# Patient Record
Sex: Male | Born: 1955 | Race: White | Hispanic: No | Marital: Single
Health system: Midwestern US, Academic
[De-identification: ages and names within clinical notes are randomized; demographics above are authoritative.]

---

## 2016-09-18 ENCOUNTER — Encounter: Admit: 2016-09-18 | Discharge: 2016-09-18 | Payer: MEDICARE

## 2016-09-24 ENCOUNTER — Encounter: Admit: 2016-09-24 | Discharge: 2016-09-24 | Payer: MEDICARE

## 2016-10-07 ENCOUNTER — Encounter: Admit: 2016-10-07 | Discharge: 2016-10-07 | Payer: MEDICARE

## 2016-10-07 DIAGNOSIS — F1190 Opioid use, unspecified, uncomplicated: ICD-10-CM

## 2016-10-07 DIAGNOSIS — H3530 Unspecified macular degeneration: ICD-10-CM

## 2016-10-07 DIAGNOSIS — E785 Hyperlipidemia, unspecified: ICD-10-CM

## 2016-10-07 DIAGNOSIS — R270 Ataxia, unspecified: ICD-10-CM

## 2016-10-07 DIAGNOSIS — M79606 Pain in leg, unspecified: ICD-10-CM

## 2016-10-07 DIAGNOSIS — I739 Peripheral vascular disease, unspecified: Principal | ICD-10-CM

## 2016-10-07 DIAGNOSIS — M549 Dorsalgia, unspecified: ICD-10-CM

## 2016-10-07 DIAGNOSIS — Z720 Tobacco use: ICD-10-CM

## 2016-10-07 DIAGNOSIS — E119 Type 2 diabetes mellitus without complications: ICD-10-CM

## 2016-10-07 DIAGNOSIS — I10 Essential (primary) hypertension: ICD-10-CM

## 2016-10-08 ENCOUNTER — Ambulatory Visit: Admit: 2016-10-08 | Discharge: 2016-10-08 | Payer: MEDICARE

## 2016-10-08 ENCOUNTER — Encounter: Admit: 2016-10-08 | Discharge: 2016-10-08 | Payer: MEDICARE

## 2016-10-08 DIAGNOSIS — M549 Dorsalgia, unspecified: ICD-10-CM

## 2016-10-08 DIAGNOSIS — E7800 Pure hypercholesterolemia, unspecified: ICD-10-CM

## 2016-10-08 DIAGNOSIS — E1151 Type 2 diabetes mellitus with diabetic peripheral angiopathy without gangrene: ICD-10-CM

## 2016-10-08 DIAGNOSIS — M79606 Pain in leg, unspecified: ICD-10-CM

## 2016-10-08 DIAGNOSIS — I739 Peripheral vascular disease, unspecified: Principal | ICD-10-CM

## 2016-10-08 DIAGNOSIS — Z720 Tobacco use: ICD-10-CM

## 2016-10-08 DIAGNOSIS — I10 Essential (primary) hypertension: ICD-10-CM

## 2016-10-08 DIAGNOSIS — H3530 Unspecified macular degeneration: ICD-10-CM

## 2016-10-08 DIAGNOSIS — F1190 Opioid use, unspecified, uncomplicated: ICD-10-CM

## 2016-10-08 DIAGNOSIS — R270 Ataxia, unspecified: ICD-10-CM

## 2016-10-08 DIAGNOSIS — E119 Type 2 diabetes mellitus without complications: ICD-10-CM

## 2016-10-08 DIAGNOSIS — E785 Hyperlipidemia, unspecified: ICD-10-CM

## 2016-10-09 NOTE — Progress Notes
Date of Service: 10/08/2016              Chief Complaint   Patient presents with   ??? Claudication   ??? Peripheral Artery Disease       History of Present Illness    He is a 61 year old male who presents with bilateral calf pain.  He reports walking approximately 1/8 of a mile prior to onset of the pain.  He denies any pain at rest in his feet or toes.  He denies any open ulcers.  He is walking significantly limits his activities of daily living.  He had previous noninvasive testing including an ankle-brachial index in 2016 that was 0.73 on the right and 0.68 on the left.  An MR angiogram at that time showed left common iliac artery narrowing as well as  some tibial artery disease.  He reports that his symptoms have worsened some over the last 2 years.    He denies any chest pain or shortness of breath.           Past Medical History:   Diagnosis Date   ??? Ataxia    ??? Chronic back pain    ??? Chronic, continuous use of opioids    ??? Diabetes mellitus (HCC)    ??? Hyperlipidemia    ??? Hypertension    ??? Leg pain    ??? Macular degeneration    ??? Peripheral arterial disease (HCC)    ??? Tobacco abuse        Past Surgical History:   Procedure Laterality Date   ??? HERNIA REPAIR Right 2015   ??? APPENDECTOMY     ??? CHOLECYSTECTOMY     ??? TENDON REPAIR      Forearm        Allergies:  Allergies   Allergen Reactions   ??? Ambien [Zolpidem] SEE COMMENTS     Confusion and disorientation   ??? Duragesic [Fentanyl] HIVES and RASH       Medication List:  ??? aspirin 81 mg chewable tablet Chew 81 mg by mouth daily. Take with food.   ??? atorvastatin (LIPITOR) 20 mg tablet Take 20 mg by mouth at bedtime daily.   ??? diazePAM (VALIUM) 10 mg tablet TAKE 1 TABLET BY MOUTH EVERY SIX HOURS AS NEEDED FOR ANXIETY   ??? hydroCHLOROthiazide (HYDRODIURIL) 25 mg tablet Take 25 mg by mouth every morning.   ??? LEVEMIR FLEXTOUCH U-100 INSULN 100 unit/mL (3 mL) injection pen INJECT 50 UNITS SUB-Q TWO TIMES A DAY ??? methadone (DOLOPHINE; METHADOSE) 10 mg tablet TAKE 4-6 TABLETS BY MOUTH EVERY FOUR HOURS AS NEEDED (MAX 16 TABS/24 HOURS)   ??? NOVOLOG FLEXPEN U-100 INSULIN 100 unit/mL injection PEN INJECT 15 UNITS SUB-Q THREE TIMES DAILY WITH MEALS   ??? oxyCODONE (ROXICODONE, OXY-IR) 5 mg tablet Take 5 mg by mouth   ??? ramipril (ALTACE) 10 mg capsule Take 10 mg by mouth daily.       Social History:   reports that he quit smoking about 4 months ago. His smoking use included Cigarettes. He smoked 1.00 pack per day. He has never used smokeless tobacco. He reports that he drinks alcohol. He reports that he does not use drugs.    Family History   Problem Relation Age of Onset   ??? Circulatory problem Father    ??? Hypertension Father    ??? Circulatory problem Brother    ??? Hypertension Sister    ??? Hypertension Paternal Grandfather        Review  of Systems   HENT: Positive for hearing loss and tinnitus.    Eyes: Positive for visual disturbance.   Respiratory: Positive for apnea.    Musculoskeletal: Positive for arthralgias, back pain and myalgias.   Neurological: Positive for dizziness, light-headedness and numbness.   Psychiatric/Behavioral: The patient is nervous/anxious.    All other systems reviewed and are negative.              Vitals:    10/08/16 1324   BP: 150/90   Pulse: 72   Resp: 16   Weight: 88.5 kg (195 lb)   Height: 175.3 cm (69)     Body mass index is 28.8 kg/m???.     Physical Exam   Constitutional: He is oriented to person, place, and time. He appears well-developed and well-nourished. No distress.   Neck: Neck supple.   No carotid bruits   Cardiovascular: Normal rate and regular rhythm.    No murmur heard.  Pulmonary/Chest: Effort normal and breath sounds normal.   Abdominal: Soft. He exhibits no distension. There is no tenderness.   Musculoskeletal:   Palpable radial and femoral pulses (right femoral stronger than left)  Monophasic Doppler signals in pedal pulses bilaterally  No significant peripheral edema No areas of skin breakdown   Neurological: He is alert and oriented to person, place, and time.   No focal neurologic deficits   Psychiatric: He has a normal mood and affect. His behavior is normal.   Nursing note and vitals reviewed.          Assessment and Plan:    1. Peripheral artery disease (HCC)     2. Type II diabetes mellitus with peripheral circulatory disorder (HCC)     3.  Hypercholesterolemia    I outlined the potential causes of leg pain.  It is possible that his leg pain is due to arterial insufficiency.  He may also have a component of nerve compression from his back that is also contributing to his pain. I discussed options to decrease further atherosclerotic progression including good blood pressure and cholesterol control as well as good blood sugar control.  I also discussed an exercise walking program.  He is currently doing a significant exercise program, which has not improved his walking distance.  I discussed potential further evaluation with angiogram and possible percutaneous intervention.  The risks and potential benefits of angiogram as well as intervention were discussed in detail.  These risks include but are not limited to bleeding, infection, contrast reaction (nephropathy or allergy), cardiac/pulmonary complications, and death.  He acknowledged and requested to proceed.  He feels that his pain is severe enough to warrant intervention at this time understanding that this may not completely alleviate his symptoms.  We will schedule angiogram with possible percutaneous intervention in the near future.    Gladstone Lighter, MD

## 2016-10-12 ENCOUNTER — Encounter: Admit: 2016-10-12 | Discharge: 2016-10-12 | Payer: MEDICARE

## 2016-10-12 DIAGNOSIS — H3530 Unspecified macular degeneration: ICD-10-CM

## 2016-10-12 DIAGNOSIS — E119 Type 2 diabetes mellitus without complications: ICD-10-CM

## 2016-10-12 DIAGNOSIS — Z720 Tobacco use: ICD-10-CM

## 2016-10-12 DIAGNOSIS — M549 Dorsalgia, unspecified: ICD-10-CM

## 2016-10-12 DIAGNOSIS — F1190 Opioid use, unspecified, uncomplicated: ICD-10-CM

## 2016-10-12 DIAGNOSIS — R270 Ataxia, unspecified: ICD-10-CM

## 2016-10-12 DIAGNOSIS — M79606 Pain in leg, unspecified: ICD-10-CM

## 2016-10-12 DIAGNOSIS — I10 Essential (primary) hypertension: ICD-10-CM

## 2016-10-12 DIAGNOSIS — E785 Hyperlipidemia, unspecified: ICD-10-CM

## 2016-10-12 DIAGNOSIS — I739 Peripheral vascular disease, unspecified: Principal | ICD-10-CM

## 2016-10-13 ENCOUNTER — Encounter: Admit: 2016-10-13 | Discharge: 2016-10-13 | Payer: MEDICARE

## 2016-10-13 DIAGNOSIS — I739 Peripheral vascular disease, unspecified: Principal | ICD-10-CM

## 2016-10-13 NOTE — Telephone Encounter
-----   Message from Gladstone Lighter, MD sent at 10/12/2016  2:44 PM CDT -----  Please schedule for AARO, possible intervention at [facility]  KH

## 2016-10-13 NOTE — Telephone Encounter
Contacted the patient to coordinate the [facility], he was provided the information listed below and encouraged to call with any questions or concerns.    Your cath lab procedure is scheduled for Friday November 14, 2016     The cath lab team will call you the day before the scheduled procedure between 0800 AM and 1030 AM to tell you what time to check in. The cath lab check in is in the heart hospital. The heart hospital is through the main entrance of [facility]  and take a hard right. Go about 40 steps and you will see a glass atrium with glass sculptures hanging from the ceiling. There is a desk to the left and that is the check in. Do NOT go into the [place] cardiology offices.     You will be nothing by mouth (NPO) after midnight, they day before your surgery. Hold all regular insulins that morning and if you take lantus at bedtime normally, take 1/3rd of the dose instead of the full dose night before surgery. Take your morning blood pressure and heart pills as prescribed with a sip of water. Hold oral blood sugar lowering medications as well in the morning.       You will not spend the night after your procedure unless otherwise instructed  or unless you have a complication. Please have a ride arranged as you will most likely receive sedation and will not be ok to drive.     For other questions and concerns , call Joni Reining, RN at  [phone].

## 2016-11-12 ENCOUNTER — Ambulatory Visit: Admit: 2016-11-12 | Discharge: 2016-11-12 | Payer: MEDICARE

## 2016-11-14 ENCOUNTER — Encounter: Admit: 2016-11-14 | Discharge: 2016-11-14 | Payer: MEDICARE

## 2016-11-14 ENCOUNTER — Ambulatory Visit: Admit: 2016-11-14 | Discharge: 2016-11-14 | Payer: MEDICARE

## 2016-11-14 DIAGNOSIS — Z7982 Long term (current) use of aspirin: ICD-10-CM

## 2016-11-14 DIAGNOSIS — I739 Peripheral vascular disease, unspecified: Principal | ICD-10-CM

## 2016-11-14 DIAGNOSIS — H3530 Unspecified macular degeneration: ICD-10-CM

## 2016-11-14 DIAGNOSIS — Z87891 Personal history of nicotine dependence: ICD-10-CM

## 2016-11-14 DIAGNOSIS — Z720 Tobacco use: ICD-10-CM

## 2016-11-14 DIAGNOSIS — E785 Hyperlipidemia, unspecified: ICD-10-CM

## 2016-11-14 DIAGNOSIS — E119 Type 2 diabetes mellitus without complications: ICD-10-CM

## 2016-11-14 DIAGNOSIS — E1151 Type 2 diabetes mellitus with diabetic peripheral angiopathy without gangrene: ICD-10-CM

## 2016-11-14 DIAGNOSIS — M549 Dorsalgia, unspecified: ICD-10-CM

## 2016-11-14 DIAGNOSIS — I10 Essential (primary) hypertension: ICD-10-CM

## 2016-11-14 DIAGNOSIS — G8929 Other chronic pain: ICD-10-CM

## 2016-11-14 DIAGNOSIS — F1190 Opioid use, unspecified, uncomplicated: ICD-10-CM

## 2016-11-14 DIAGNOSIS — M79606 Pain in leg, unspecified: ICD-10-CM

## 2016-11-14 DIAGNOSIS — R270 Ataxia, unspecified: ICD-10-CM

## 2016-11-14 LAB — CBC AND DIFF
Lab: 0.2 10*3/uL (ref 0–0.20)
Lab: 0.3 10*3/uL (ref 0–0.45)
Lab: 0.6 10*3/uL (ref 0–0.80)
Lab: 10 10*3/uL (ref 4.5–11.0)
Lab: 2 % (ref 0–2)
Lab: 2.5 10*3/uL (ref 1.0–4.8)
Lab: 3 % (ref 0–5)
Lab: 6 % (ref 4–12)
Lab: 6.8 10*3/uL (ref 1.8–7.0)

## 2016-11-14 LAB — BASIC METABOLIC PANEL
Lab: 0.7 mg/dL (ref 0.4–1.24)
Lab: 135 MMOL/L — ABNORMAL LOW (ref 137–147)
Lab: 16 mg/dL (ref 7–25)
Lab: 283 mg/dL — ABNORMAL HIGH (ref 70–100)
Lab: 29 MMOL/L (ref 21–30)
Lab: 3.7 MMOL/L (ref 3.5–5.1)
Lab: 7 pg (ref 3–12)
Lab: 9.1 mg/dL (ref 8.5–10.6)
Lab: 99 MMOL/L (ref 98–110)
Lab: >60 mL/min (ref >60)
Lab: >60 mL/min (ref >60)

## 2016-11-14 LAB — POC GLUCOSE: Lab: 254 mg/dL — ABNORMAL HIGH (ref 70–100)

## 2016-11-14 MED ORDER — NITROGLYCERIN 0.4 MG SL SUBL
.4 mg | SUBLINGUAL | 0 refills | Status: DC | PRN
Start: 2016-11-14 — End: 2016-11-14

## 2016-11-14 MED ORDER — DIPHENHYDRAMINE HCL 25 MG PO CAP
25 mg | ORAL | 0 refills | Status: DC | PRN
Start: 2016-11-14 — End: 2016-11-14

## 2016-11-14 MED ORDER — SODIUM CHLORIDE 0.9 % IV SOLP
250 mL | INTRAVENOUS | 0 refills | Status: DC | PRN
Start: 2016-11-14 — End: 2016-11-14

## 2016-11-14 MED ORDER — ACETAMINOPHEN 325 MG PO TAB
650 mg | ORAL | 0 refills | Status: AC | PRN
Start: 2016-11-14 — End: ?

## 2016-11-14 MED ORDER — ALUMINUM-MAGNESIUM HYDROXIDE 200-200 MG/5 ML PO SUSP
30 mL | ORAL | 0 refills | Status: DC | PRN
Start: 2016-11-14 — End: 2016-11-14

## 2016-11-14 MED ORDER — DIPHENHYDRAMINE HCL 50 MG/ML IJ SOLN
25 mg | INTRAVENOUS | 0 refills | Status: DC | PRN
Start: 2016-11-14 — End: 2016-11-14

## 2016-11-14 MED ORDER — SODIUM CHLORIDE 0.9 % IV SOLP
INTRAVENOUS | 0 refills | Status: DC
Start: 2016-11-14 — End: 2016-11-14

## 2016-11-14 MED ORDER — ACETAMINOPHEN 325 MG PO TAB
650 mg | ORAL | 0 refills | Status: DC | PRN
Start: 2016-11-14 — End: 2016-11-14

## 2016-11-14 MED ORDER — SODIUM CHLORIDE 0.9 % IV SOLP
INTRAVENOUS | 0 refills | Status: DC
Start: 2016-11-14 — End: 2016-11-14
  Administered 2016-11-14: 12:00:00 1000.000 mL via INTRAVENOUS

## 2016-11-14 MED ORDER — TEMAZEPAM 15 MG PO CAP
15 mg | Freq: Every evening | ORAL | 0 refills | Status: DC | PRN
Start: 2016-11-14 — End: 2016-11-14

## 2016-11-14 MED ORDER — DOCUSATE SODIUM 100 MG PO CAP
100 mg | Freq: Every day | ORAL | 0 refills | Status: DC | PRN
Start: 2016-11-14 — End: 2016-11-14

## 2016-11-14 MED ORDER — HYDROCODONE-ACETAMINOPHEN 5-325 MG PO TAB
1 | ORAL | 0 refills | Status: DC | PRN
Start: 2016-11-14 — End: 2016-11-14

## 2016-11-14 MED ORDER — LIDOCAINE (PF) 10 MG/ML (1 %) IJ SOLN
.1-2 mL | INTRAMUSCULAR | 0 refills | Status: DC | PRN
Start: 2016-11-14 — End: 2016-11-14

## 2016-11-14 MED ORDER — MORPHINE 2 MG/ML IV CRTG
2-4 mg | INTRAVENOUS | 0 refills | Status: DC | PRN
Start: 2016-11-14 — End: 2016-11-14

## 2016-11-14 MED ORDER — ASPIRIN 325 MG PO TAB
325 mg | Freq: Once | ORAL | 0 refills | Status: CP
Start: 2016-11-14 — End: ?
  Administered 2016-11-14: 13:00:00 325 mg via ORAL

## 2016-11-14 MED ORDER — ONDANSETRON HCL (PF) 4 MG/2 ML IJ SOLN
4 mg | INTRAVENOUS | 0 refills | Status: DC | PRN
Start: 2016-11-14 — End: 2016-11-14

## 2016-11-14 NOTE — Progress Notes
Patient arrived on unit via ambulation accompanied by family. Patient transferred to the bed without assistance. Frailty score equals 2  Assessment completed, refer to flowsheet for details. Orders released, reviewed, and implemented as appropriate. Oriented to surroundings, call light within reach. Plan of care reviewed.  Will continue to monitor and assess.

## 2016-11-14 NOTE — H&P (View-Only)
Admission History and Physical Examination      Name:  Angel Morgan                                             MRN:  [id number]   Admission Date:  11/14/2016                     Assessment/Plan:    Claudication   PAD  __________________________________________________________________________________  Primary Care Physician: Keturah Barre  Verified    Chief Complaint:  Bilateral calf pain   History of Present Illness: He is a 61 year old male who presents with bilateral calf pain.  He reports walking approximately 1/8 of a mile prior to onset of the pain.  He denies any pain at rest in his feet or toes.  He denies any open ulcers.  He had previous noninvasive testing including an ankle-brachial index in 2016 that was 0.73 on the right and 0.68 on the left.  An MR angiogram at that time showed left common iliac artery narrowing as well as  some tibial artery disease.  He reports that his symptoms have worsened some over the last 2 years. He would like to proceed with an aaortogram with bilateral runoff an possible intervention to assess his leg arteries    Past Medical History:   Diagnosis Date   ??? Ataxia    ??? Chronic back pain    ??? Chronic, continuous use of opioids    ??? Diabetes mellitus (HCC)    ??? Hyperlipidemia    ??? Hypertension    ??? Leg pain    ??? Macular degeneration    ??? Peripheral arterial disease (HCC)    ??? Tobacco abuse      Past Surgical History:   Procedure Laterality Date   ??? HERNIA REPAIR Right 2015   ??? APPENDECTOMY     ??? CHOLECYSTECTOMY     ??? TENDON REPAIR      Forearm      Family history reviewed; non-contributory  Social History     Social History   ??? Marital status: Single     Spouse name: N/A   ??? Number of children: N/A   ??? Years of education: N/A     Social History Main Topics   ??? Smoking status: Former Smoker     Packs/day: 1.00     Types: Cigarettes     Quit date: 06/07/2016   ??? Smokeless tobacco: Never Used   ??? Alcohol use Yes      Comment: Rare ??? Drug use: No   ??? Sexual activity: Not on file     Other Topics Concern   ??? Not on file     Social History Narrative   ??? No narrative on file      Immunizations (includes history and patient reported):   There is no immunization history on file for this patient.        Allergies:  Ambien [zolpidem] and Duragesic [fentanyl]    Medications:  Prescriptions Prior to Admission   Medication Sig   ??? aspirin 81 mg chewable tablet Chew 81 mg by mouth daily. Take with food.   ??? atorvastatin (LIPITOR) 20 mg tablet Take 20 mg by mouth at bedtime daily.   ??? diazePAM (VALIUM) 10 mg tablet TAKE 1 TABLET BY MOUTH EVERY SIX HOURS AS NEEDED FOR ANXIETY   ???  hydroCHLOROthiazide (HYDRODIURIL) 25 mg tablet Take 25 mg by mouth every morning.   ??? LEVEMIR FLEXTOUCH U-100 INSULN 100 unit/mL (3 mL) injection pen INJECT 50 UNITS SUB-Q TWO TIMES A DAY   ??? methadone (DOLOPHINE; METHADOSE) 10 mg tablet TAKE 4-6 TABLETS BY MOUTH EVERY FOUR HOURS AS NEEDED (MAX 16 TABS/24 HOURS)   ??? NOVOLOG FLEXPEN U-100 INSULIN 100 unit/mL injection PEN INJECT 15 UNITS SUB-Q THREE TIMES DAILY WITH MEALS   ??? oxyCODONE (ROXICODONE, OXY-IR) 5 mg tablet Take 5 mg by mouth   ??? ramipril (ALTACE) 10 mg capsule Take 10 mg by mouth daily.     Review of Systems:  A 14 point review of systems was negative except for: leg pain, back pain    Physical Exam:  Vital Signs: Last Filed In 24 Hours Vital Signs: 24 Hour Range                Physical Exam:   Constitutional: He is oriented to person, place, and time. He appears well-developed and well-nourished. No distress.   Neck: Neck supple.   No carotid bruits   Cardiovascular: RRR  No murmur heard.  Pulmonary/Chest: Effort normal and breath sounds normal.   Abdominal: Soft. He exhibits no distension. There is no tenderness.   Musculoskeletal:   Palpable radial and femoral pulses  Monophasic Doppler signals in pedal pulses bilaterally  No significant peripheral edema  No areas of skin breakdown Neurological: He is alert and oriented to person, place, and time.   No focal neurologic deficits   Psychiatric: He has a normal mood and affect. His behavior is normal.       Lab/Radiology/Other Diagnostic Tests:  24-hour labs:    Results for orders placed or performed during the hospital encounter of 11/14/16 (from the past 24 hour(s))   POC GLUCOSE    Collection Time: 11/14/16  6:54 AM   Result Value Ref Range    Glucose, POC 254 (H) 70 - 100 MG/DL        Pertinent radiology reviewed.    Jonette Eva, MD  Pager [phone]

## 2016-11-14 NOTE — Other
Brief Operative Note    Name: Angel Morgan. Brannigan is a 61 y.o. male     DOB: [birth year]             MRN#: [id number]  DATE OF OPERATION: 11/14/2016    Date:  11/14/2016        Preoperative Dx:   Peripheral vascular disease, unspecified (HCC) [I73.9]  leg pain    Post-op Diagnosis      * Peripheral vascular disease, unspecified (HCC) [I73.9]    Procedure(s):  CATHETER PLACEMENT ARTERIAL - ABDOMINAL/ PELVIC/ LOWER EXTREMITIY ARTERY - THIRD OR MORE BRANCH  Abdominal aortogram with runoff    Anesthesia Type: Sedation, local    Surgeon(s) and Role:     Gladstone Lighter, MD - Primary  Jonette Eva, MD - Assist      Findings:  mild to moderate left common iliac stenosis but no pressure gradient  Left tibioperoneal trunk occlusion    Estimated Blood Loss: No blood loss documented.     Specimen(s) Removed/Disposition: * No specimens in log *    Complications:  None    Implants: None    Drains: None    Disposition:  HC2 PP    Gladstone Lighter, MD  Pager

## 2016-12-03 ENCOUNTER — Encounter: Admit: 2016-12-03 | Discharge: 2016-12-03 | Payer: MEDICARE

## 2016-12-03 DIAGNOSIS — Z720 Tobacco use: ICD-10-CM

## 2016-12-03 DIAGNOSIS — F1190 Opioid use, unspecified, uncomplicated: ICD-10-CM

## 2016-12-03 DIAGNOSIS — R270 Ataxia, unspecified: ICD-10-CM

## 2016-12-03 DIAGNOSIS — M549 Dorsalgia, unspecified: ICD-10-CM

## 2016-12-03 DIAGNOSIS — E785 Hyperlipidemia, unspecified: ICD-10-CM

## 2016-12-03 DIAGNOSIS — M79606 Pain in leg, unspecified: ICD-10-CM

## 2016-12-03 DIAGNOSIS — I739 Peripheral vascular disease, unspecified: Principal | ICD-10-CM

## 2016-12-03 DIAGNOSIS — H3530 Unspecified macular degeneration: ICD-10-CM

## 2016-12-03 DIAGNOSIS — I10 Essential (primary) hypertension: ICD-10-CM

## 2016-12-03 DIAGNOSIS — E119 Type 2 diabetes mellitus without complications: ICD-10-CM

## 2017-04-30 ENCOUNTER — Encounter: Admit: 2017-04-30 | Discharge: 2017-04-30 | Payer: MEDICARE

## 2017-04-30 DIAGNOSIS — R270 Ataxia, unspecified: ICD-10-CM

## 2017-04-30 DIAGNOSIS — M549 Dorsalgia, unspecified: ICD-10-CM

## 2017-04-30 DIAGNOSIS — I739 Peripheral vascular disease, unspecified: Principal | ICD-10-CM

## 2017-04-30 DIAGNOSIS — Z720 Tobacco use: ICD-10-CM

## 2017-04-30 DIAGNOSIS — H3530 Unspecified macular degeneration: ICD-10-CM

## 2017-04-30 DIAGNOSIS — F1190 Opioid use, unspecified, uncomplicated: ICD-10-CM

## 2017-04-30 DIAGNOSIS — E119 Type 2 diabetes mellitus without complications: ICD-10-CM

## 2017-04-30 DIAGNOSIS — I10 Essential (primary) hypertension: ICD-10-CM

## 2017-04-30 DIAGNOSIS — E785 Hyperlipidemia, unspecified: ICD-10-CM

## 2017-04-30 DIAGNOSIS — M79606 Pain in leg, unspecified: ICD-10-CM

## 2020-08-21 IMAGING — CR [ID]
3 series · 3 of 3 positions shown · non-contrast
Comparison: none

[foot ap]
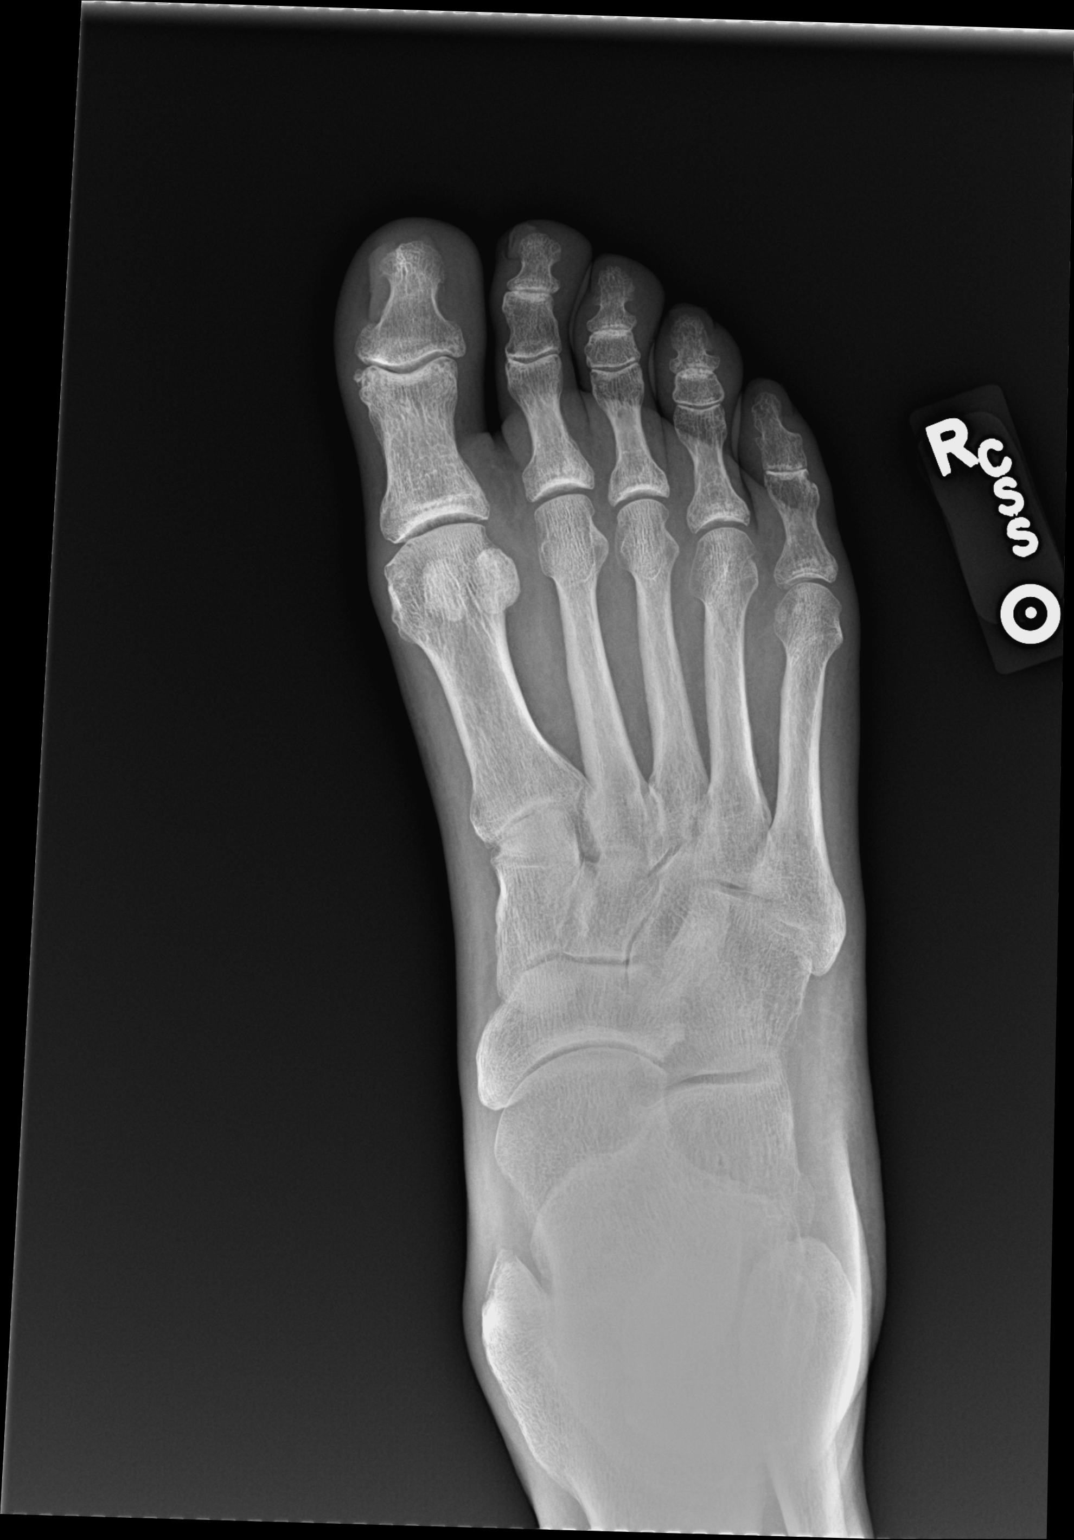

[foot obl]
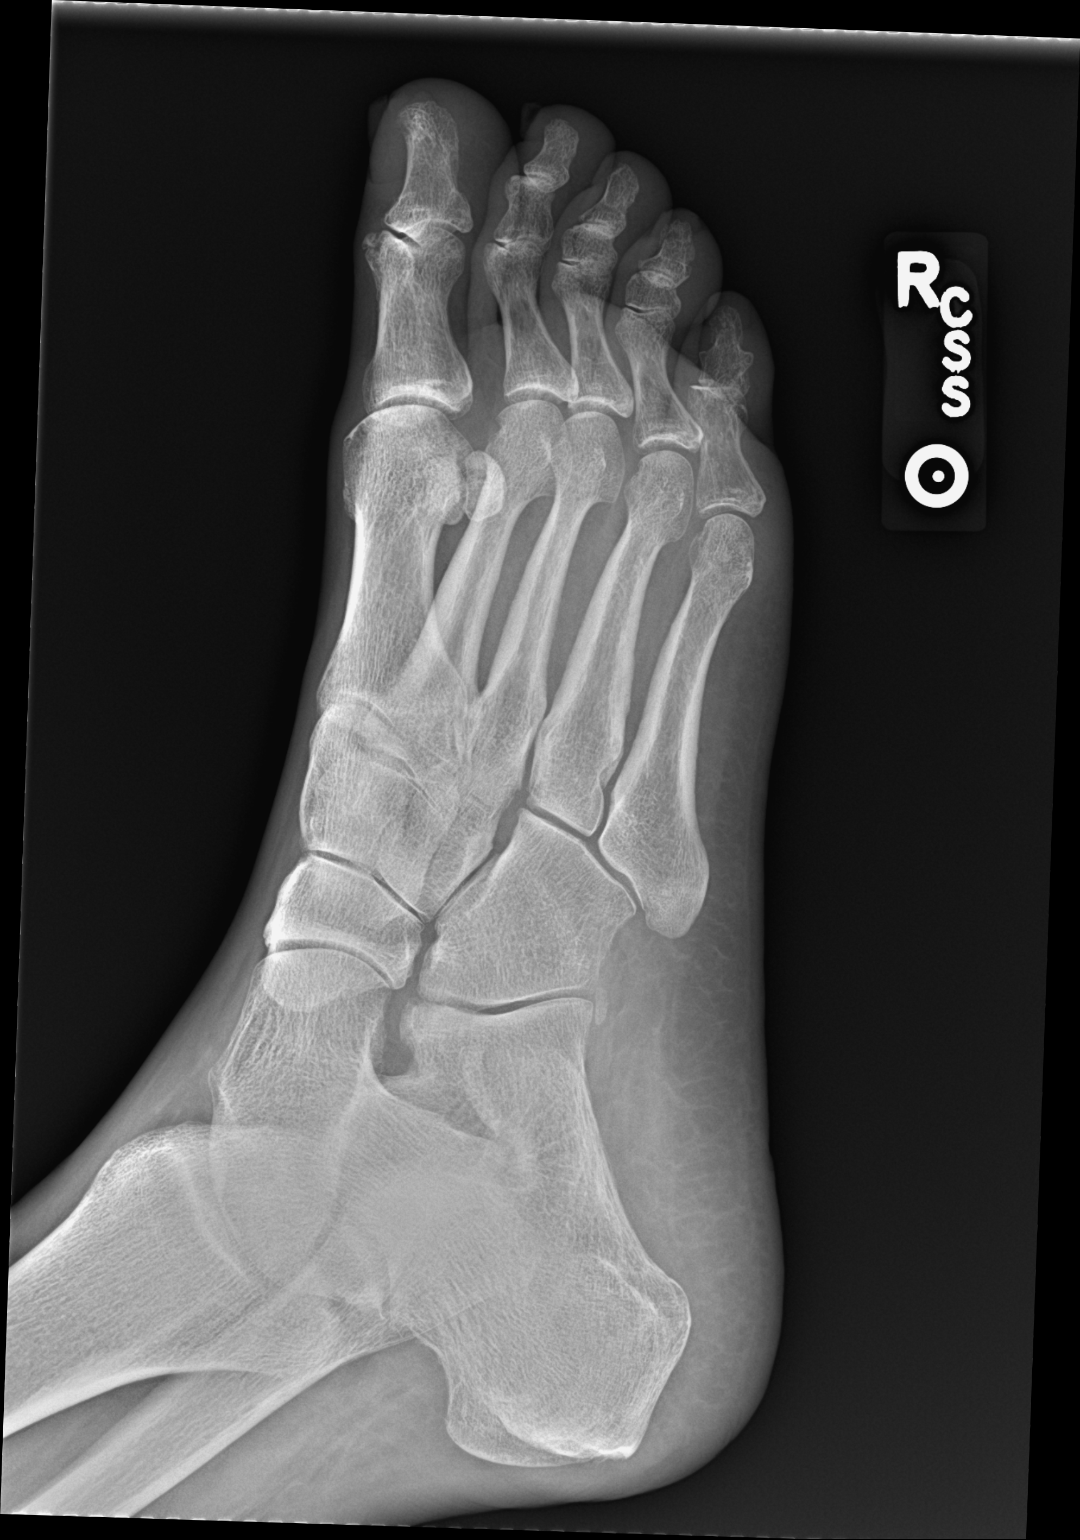

[foot lat]
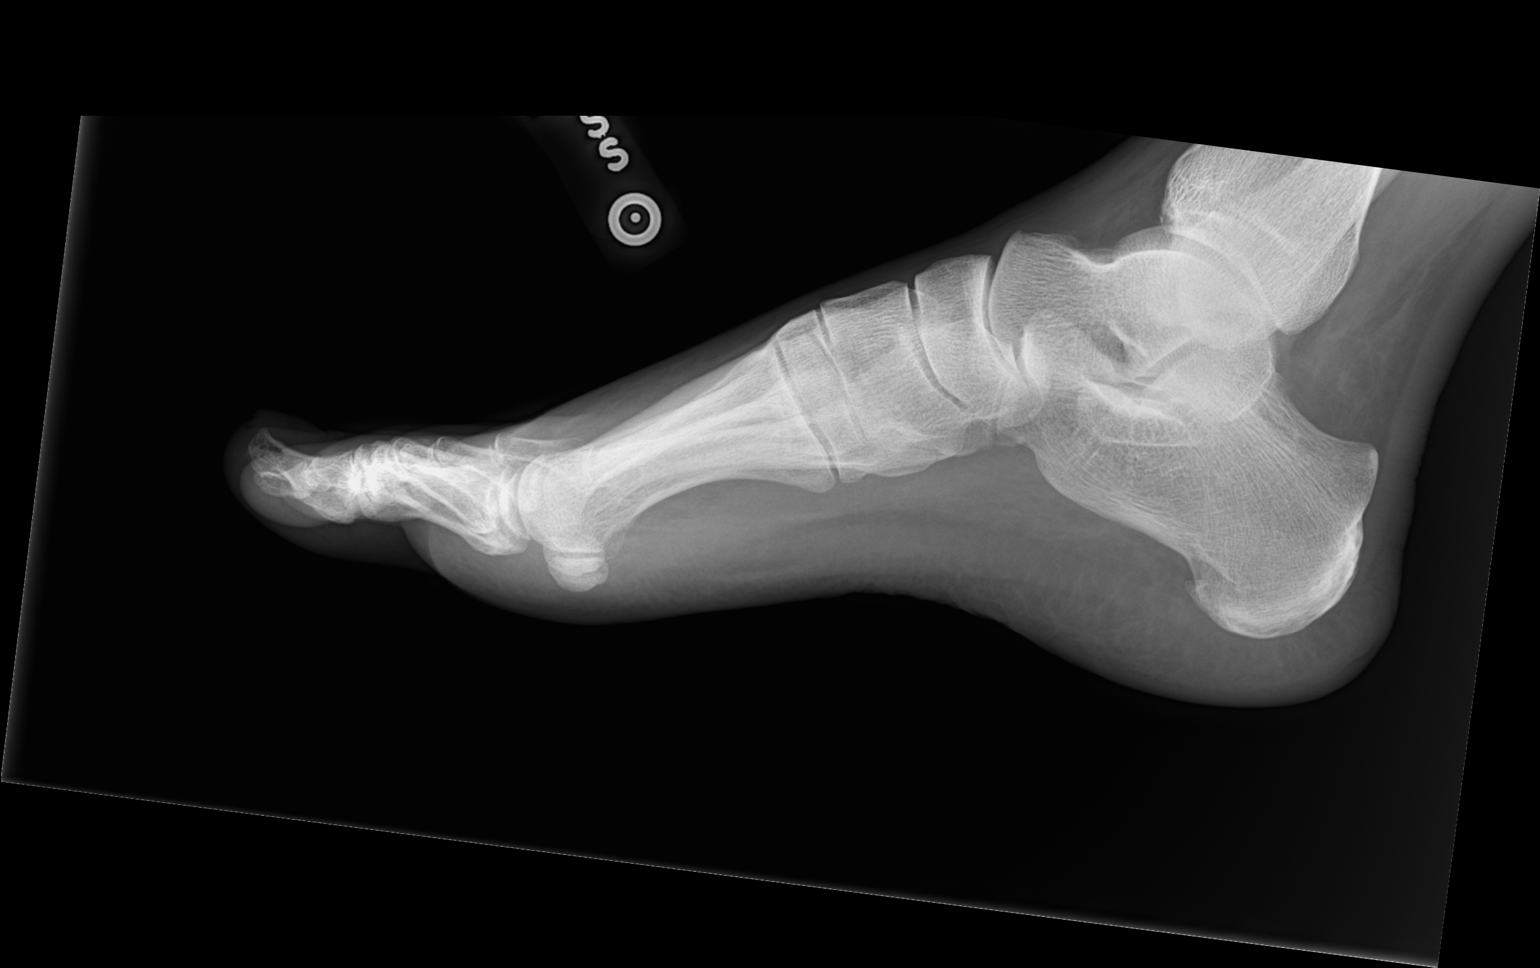

[3 of 3 positions shown; findings below may reference images not displayed]

EXAM

XR foot RT min 3V

INDICATION

Right diabetic foot ulcer

TECHNIQUE

Three views of the right foot

COMPARISONS

None available at the time of dictation.

FINDINGS

No radiographic evidence of an acute fracture, osseous malalignment, or aggressive focal osseous
lesion. There is anatomic joint alignment. Small plantar calcaneal spur. Minimal spurring at the med
ial aspect of the 1st metatarsal head/neck and 1st interphalangeal joint.

IMPRESSION
1. No radiographic evidence of osteomyelitis.

Tech Notes:

Foot ulcer. CS
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
## 2021-02-12 IMAGING — CR [ID]
3 series · 3 of 3 positions shown · non-contrast
Comparison: none

[foot ap]
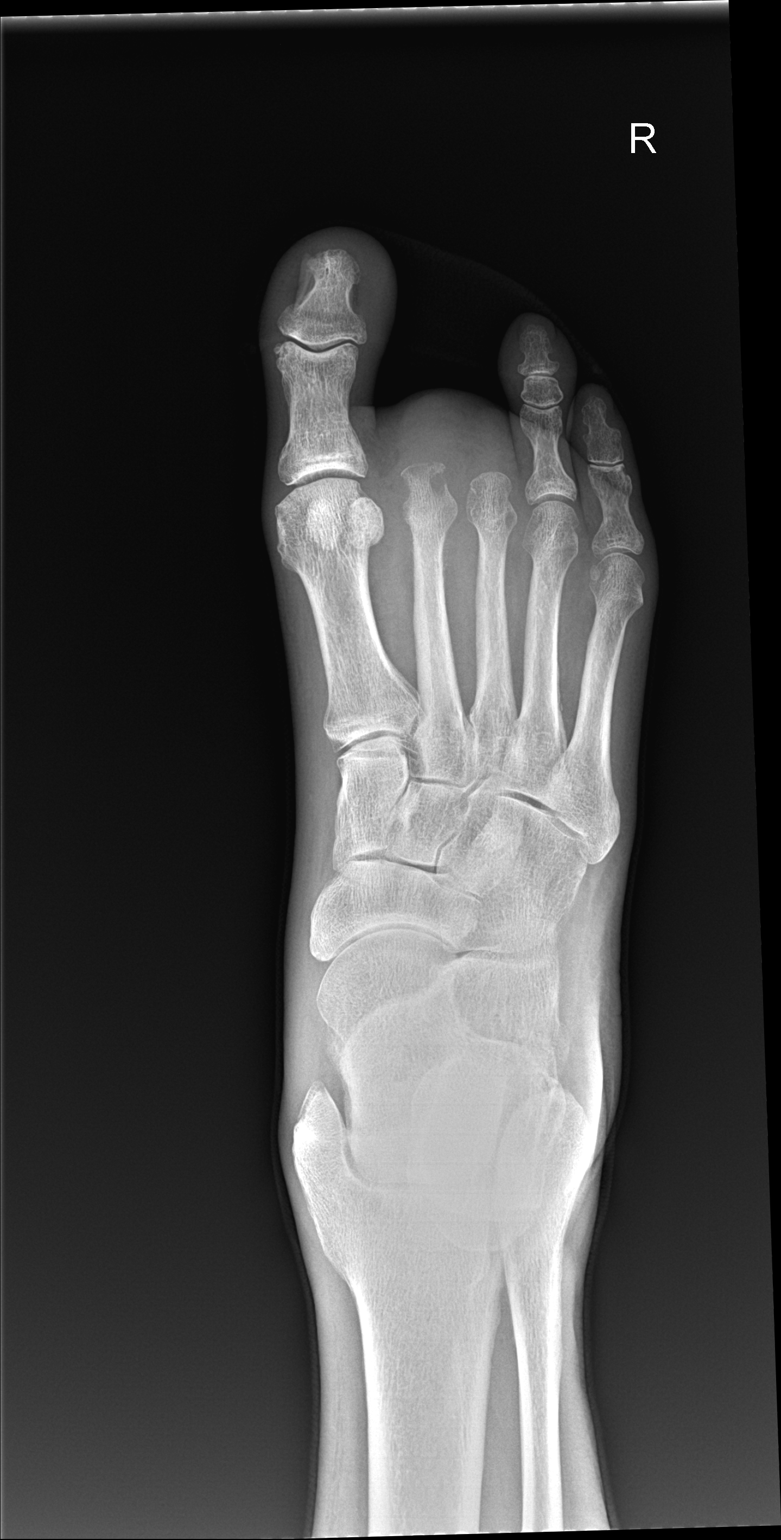

[foot obl]
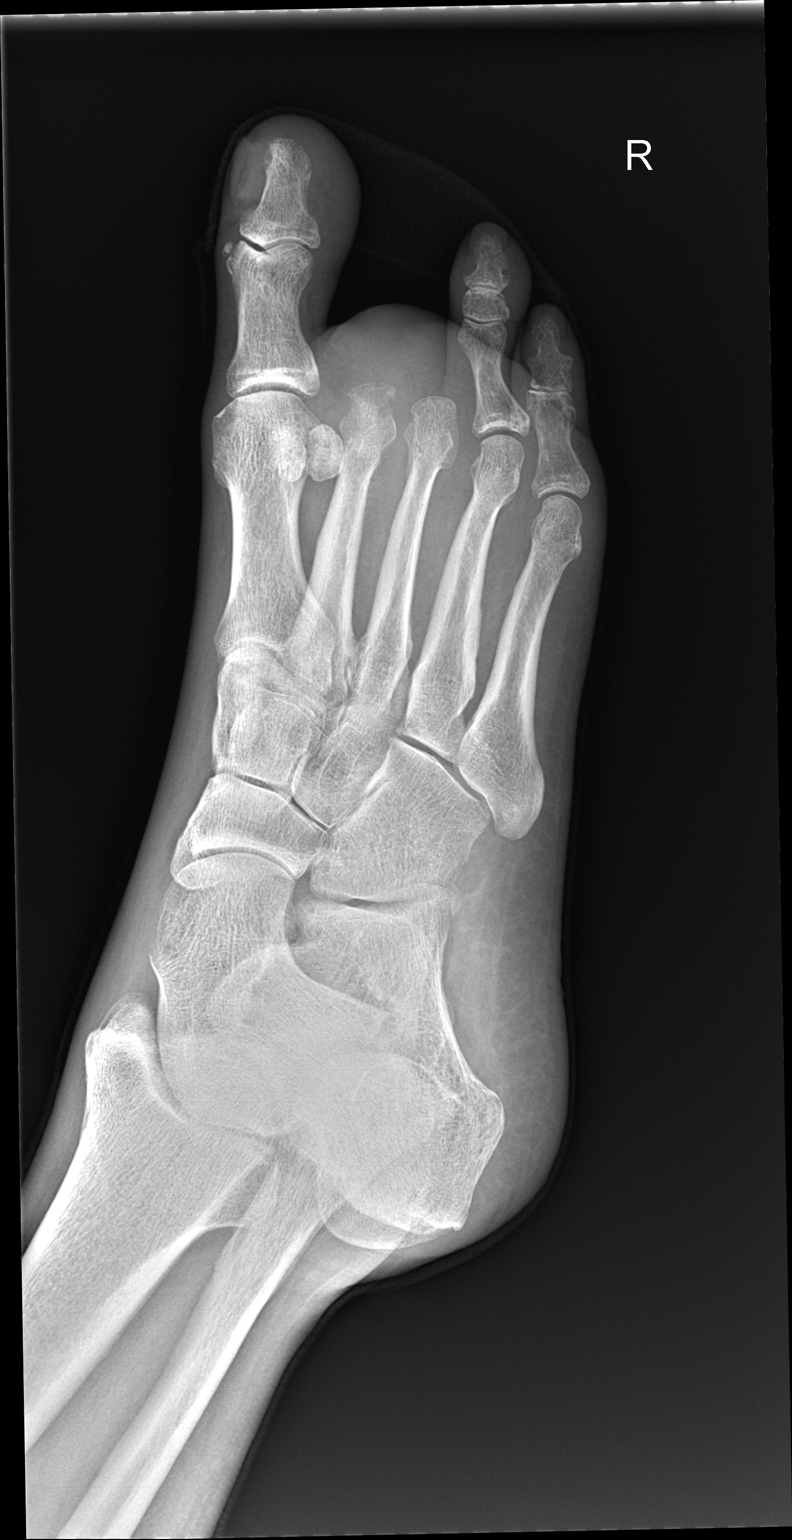

[foot lat]
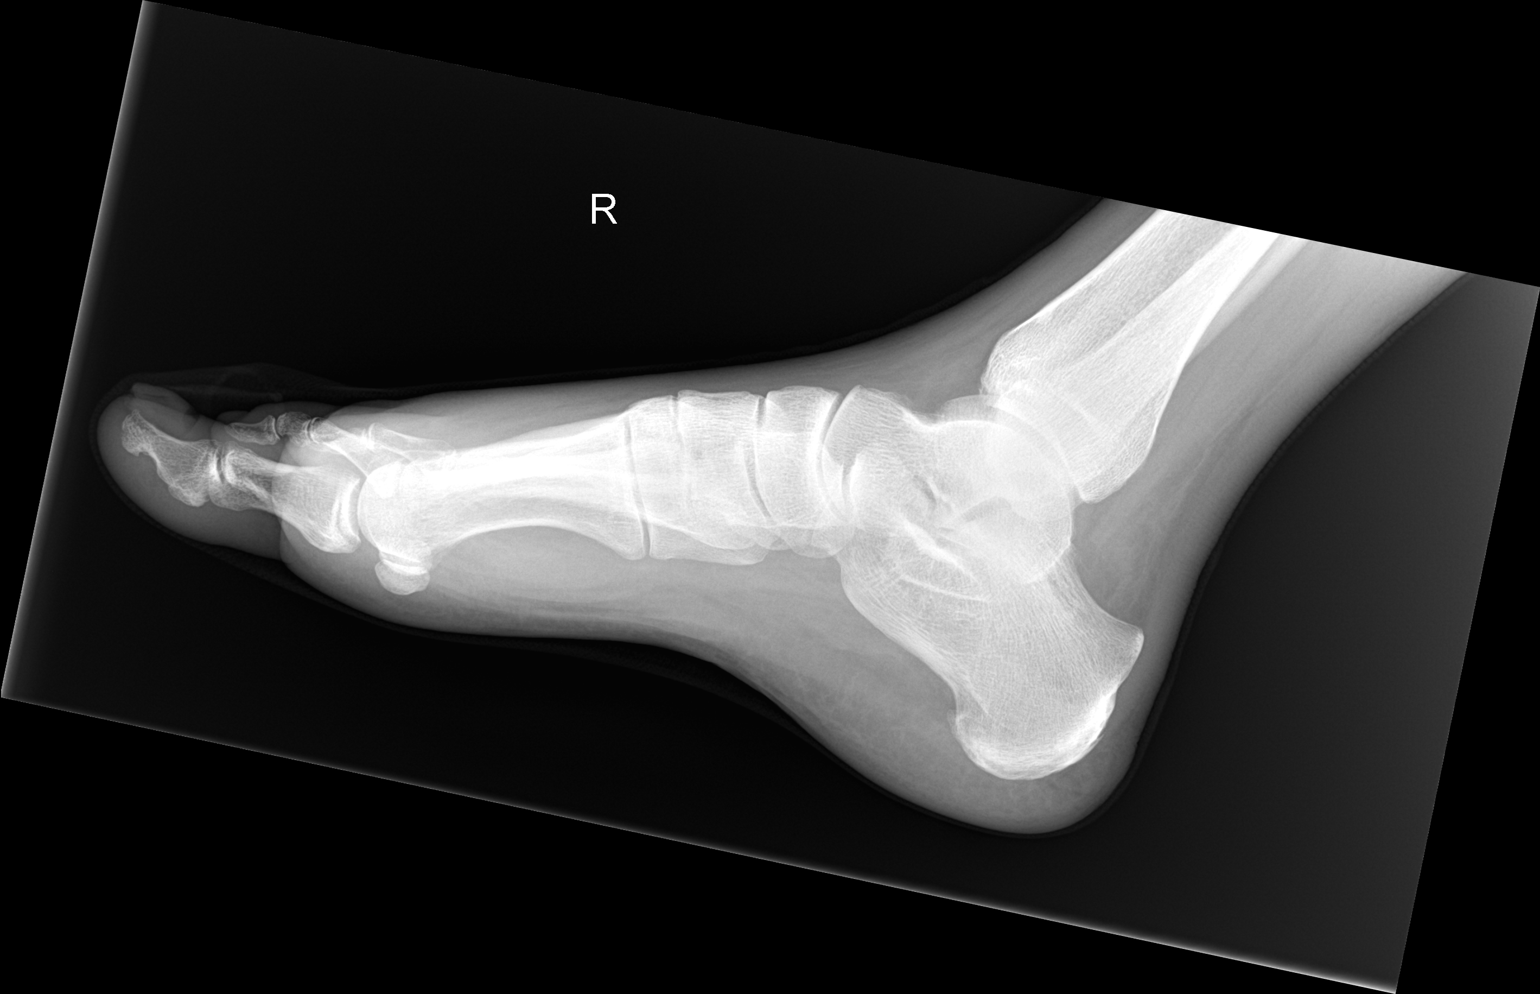

[3 of 3 positions shown; findings below may reference images not displayed]

EXAM

RADIOLOGICAL EXAMINATION, ANKLE right, foot right

INDICATION

ankle injury
rt ankle pain in ankle joint after injury to lower leg- hx of diabetes, no other trauma to ankle

COMPARISONS

None

FINDINGS

Right ankle:

There is no significant soft tissue swelling. The bone density and the ankle mortise are intact.
There is no displaced fracture or dislocation of the right ankle.

Right foot:

Previous amputation of the 2nd and 3rd right digits have been obtained.

There is bone loss and destruction involving the head of the 2nd metatarsal. Questionable early
changes of bone loss are suspected involving the head of the right 3rd metatarsal.

IMPRESSION

Soft tissue swelling of the right foot is identified. Previous surgical changes are noted involving
the right 2nd and 3rd toes. Findings most suspicious for osteomyelitis are identified involving the
head of the right 2nd metatarsal and possibly the 3rd. MR the foot is recommended for further
evaluation.

Tech Notes:

rt foot pain in heel area after injury to lower leg- hx of diabetes, surgery to remove [DATE]rd toes
for diabetic ulcers. no other surgery hx

## 2021-02-12 IMAGING — CR ANKCMRT
3 series · 3 of 3 positions shown · non-contrast
Comparison: none

[ankle ap]
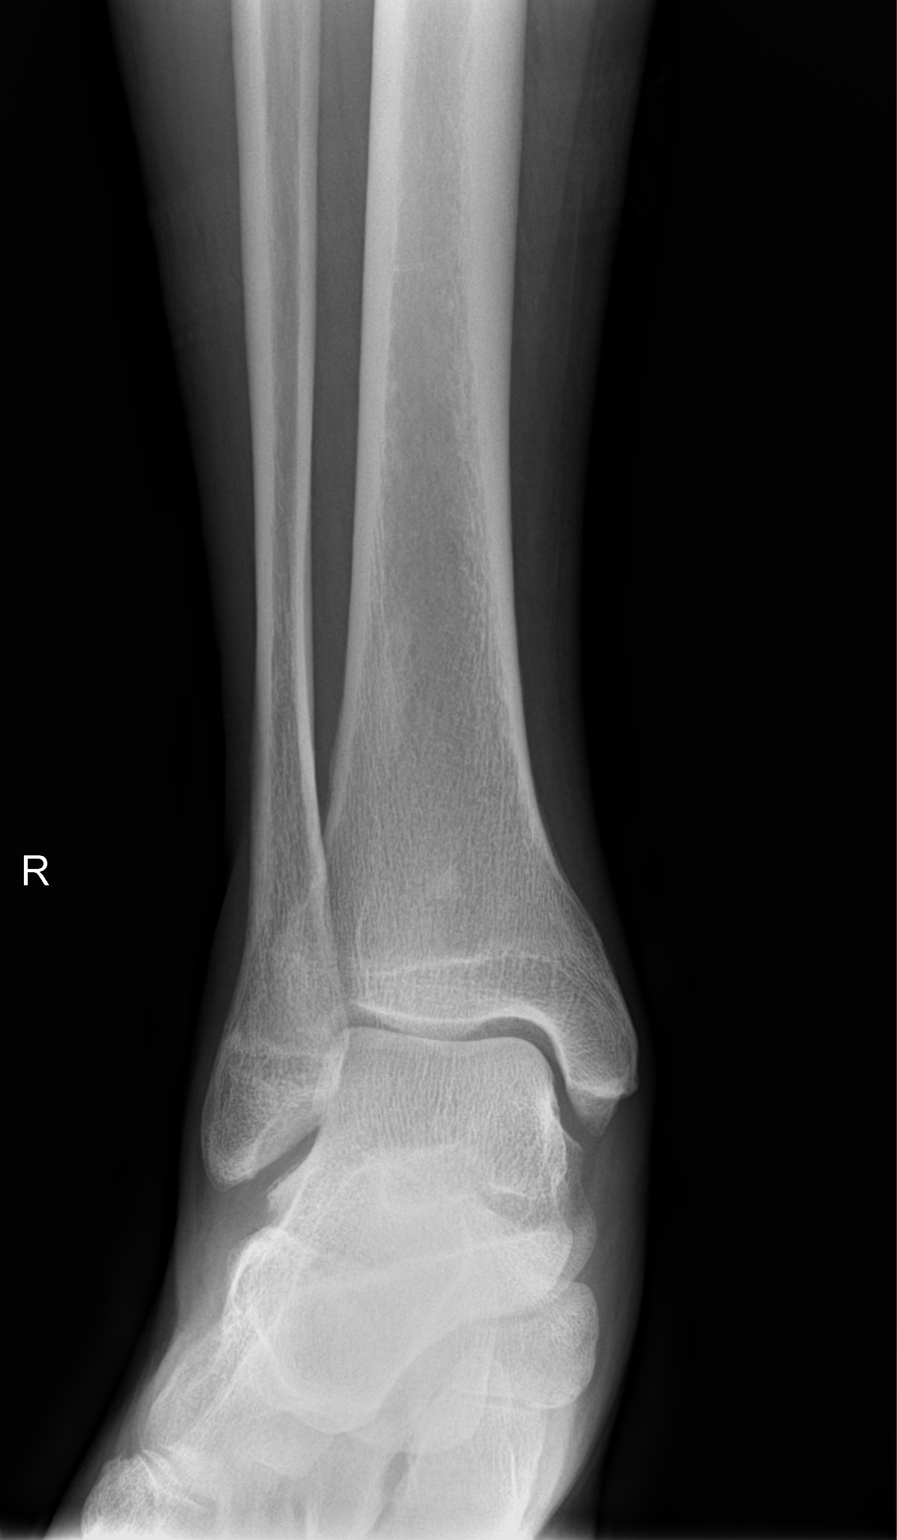

[ankle obl]
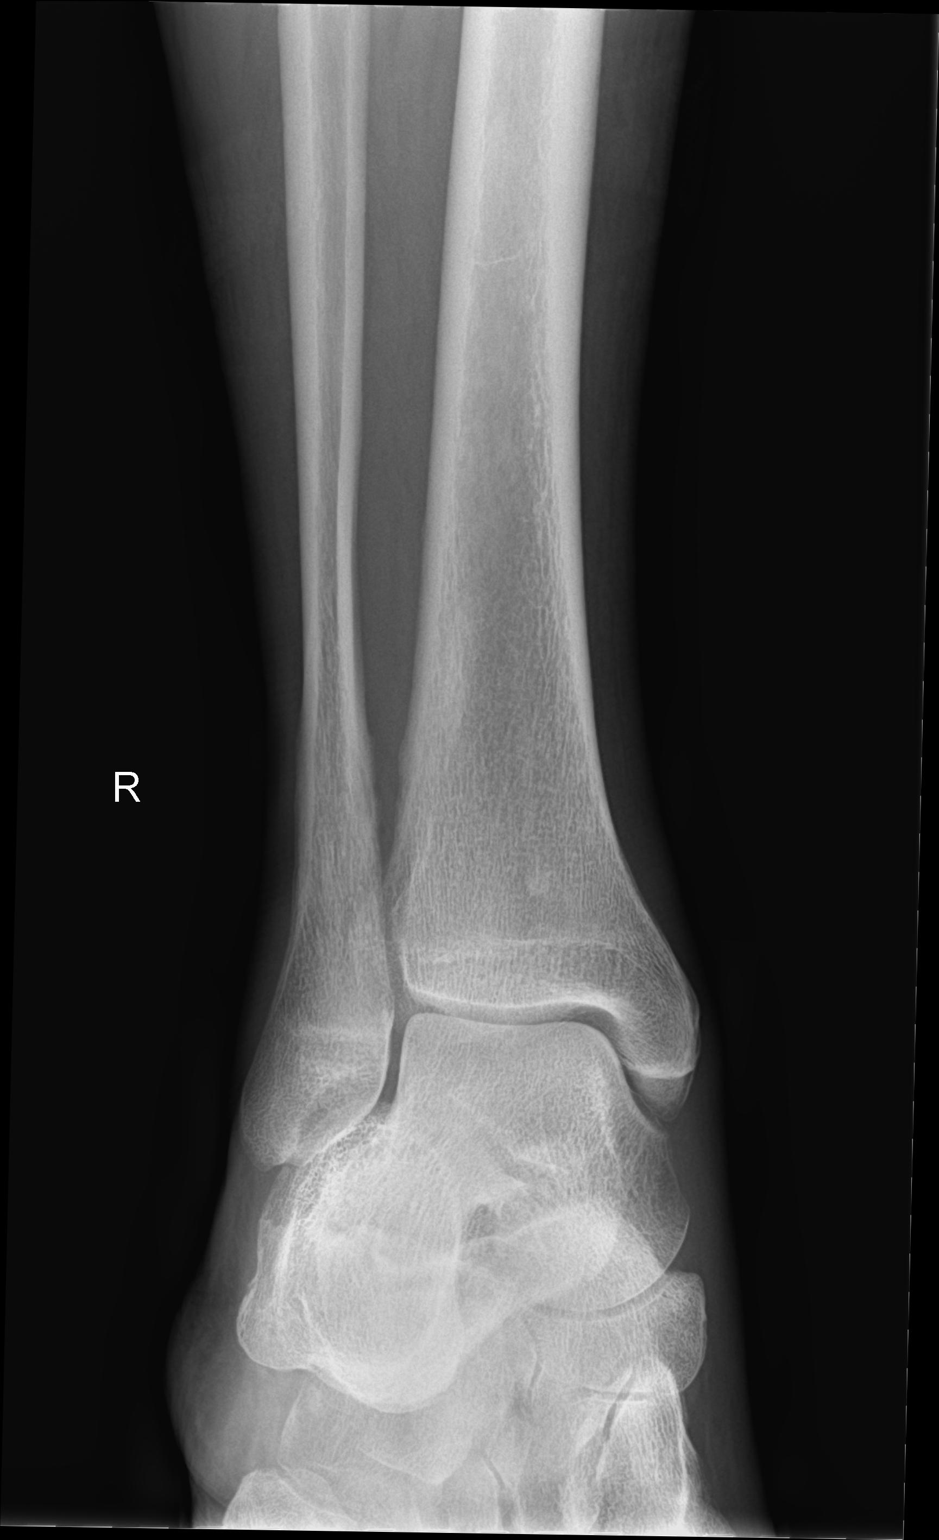

[ankle lat]
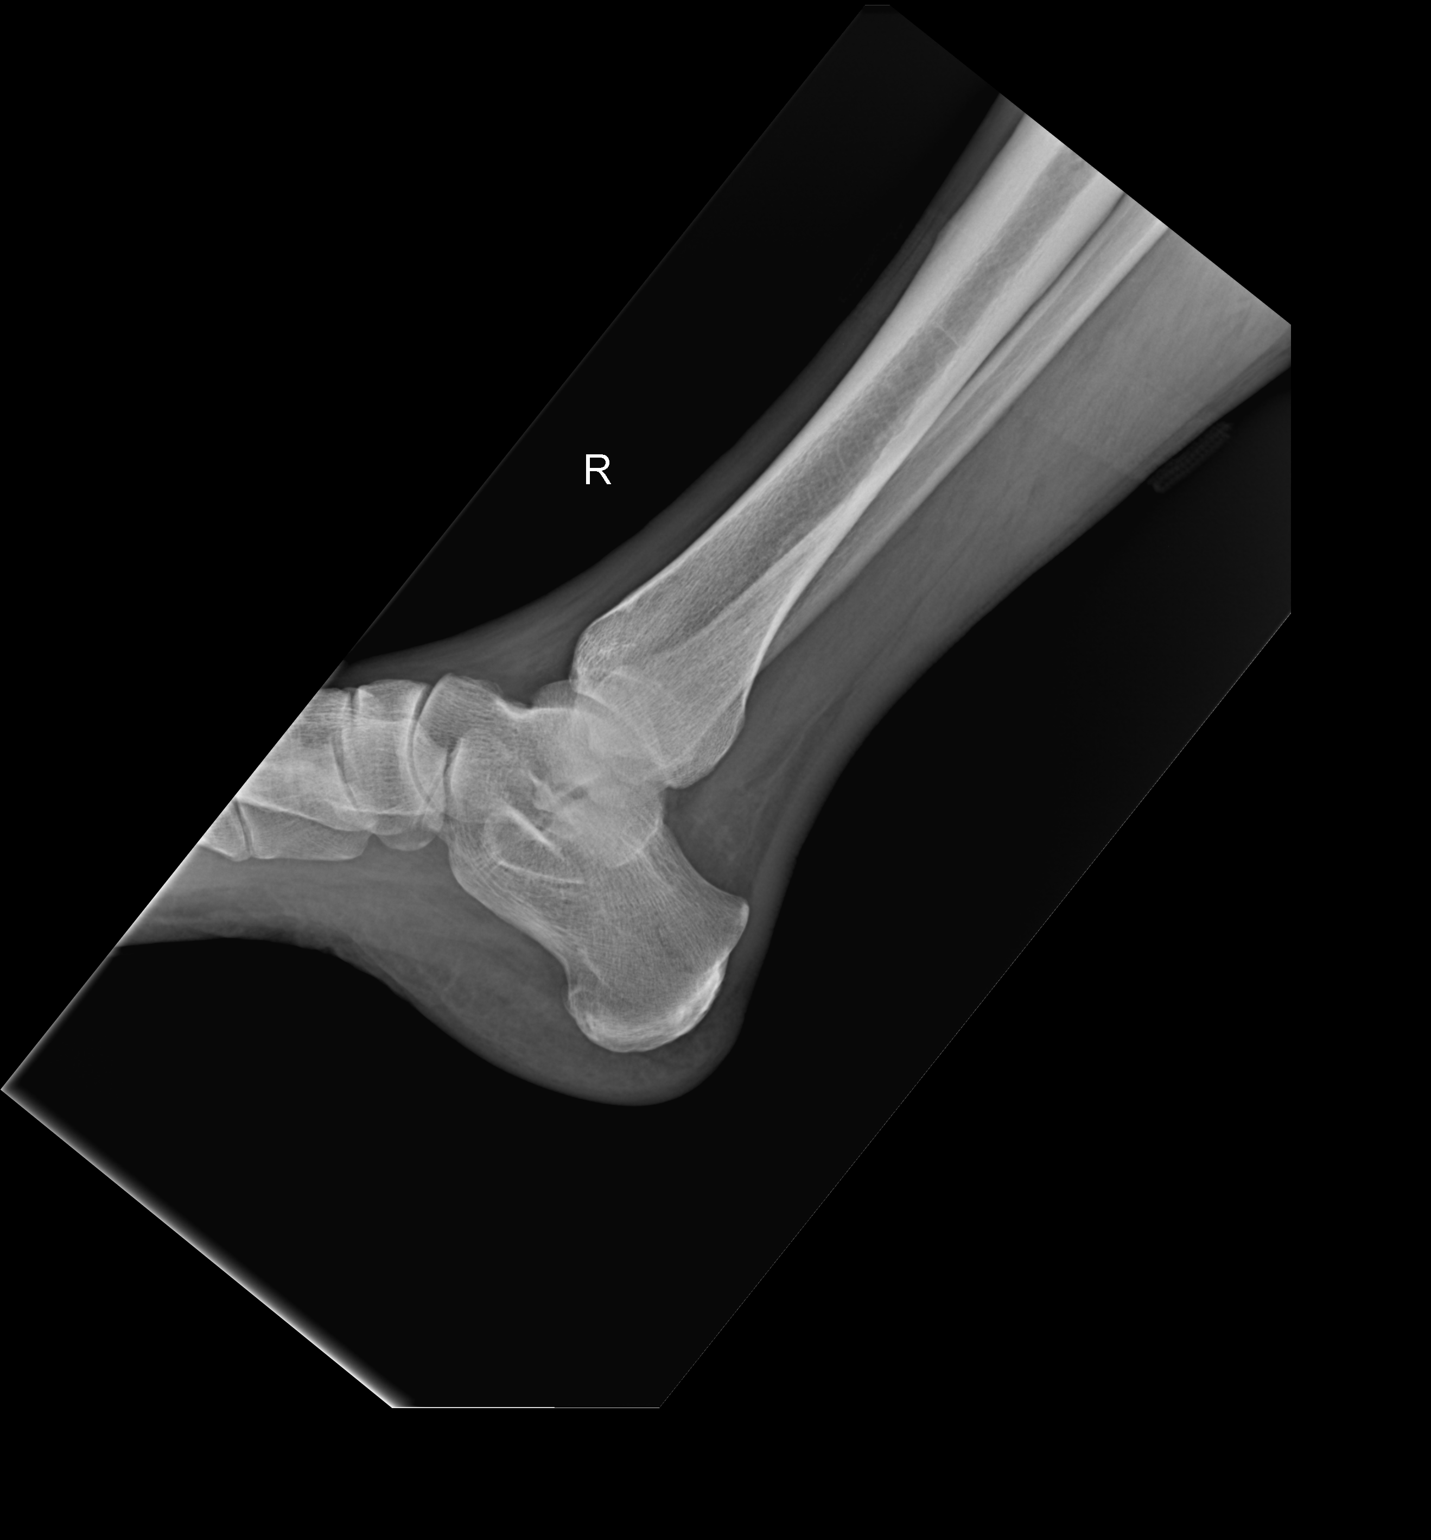

[3 of 3 positions shown; findings below may reference images not displayed]

EXAM

RADIOLOGICAL EXAMINATION, ANKLE right, foot right

INDICATION

ankle injury
rt ankle pain in ankle joint after injury to lower leg- hx of diabetes, no other trauma to ankle

COMPARISONS

None

FINDINGS

Right ankle:

There is no significant soft tissue swelling. The bone density and the ankle mortise are intact.
There is no displaced fracture or dislocation of the right ankle.

Right foot:

Previous amputation of the 2nd and 3rd right digits have been obtained.

There is bone loss and destruction involving the head of the 2nd metatarsal. Questionable early
changes of bone loss are suspected involving the head of the right 3rd metatarsal.

IMPRESSION

Soft tissue swelling of the right foot is identified. Previous surgical changes are noted involving
the right 2nd and 3rd toes. Findings most suspicious for osteomyelitis are identified involving the
head of the right 2nd metatarsal and possibly the 3rd. MR the foot is recommended for further
evaluation.

Tech Notes:

rt ankle pain in ankle joint after injury to lower leg- hx of diabetes, no other trauma to ankle

## 2021-02-25 IMAGING — MR Foot^Routine
9 series · 40 of 40 positions shown · non-contrast
Comparison: none

[Series 4: T1 · oblique · 4.0mm · 0.81mm/px · 5 of 20 slices shown (1 of 4)]
[im 1/20]
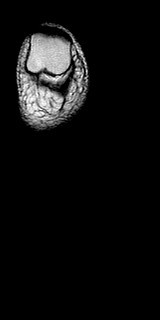
[im 5/20]
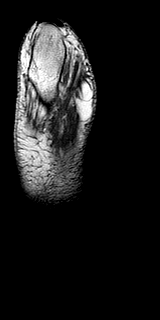
[im 10/20]
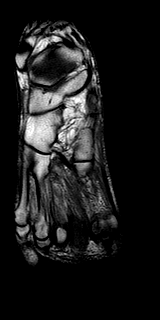
[im 15/20]
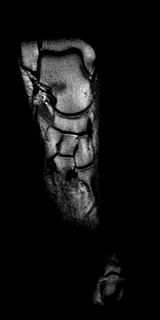
[im 20/20]
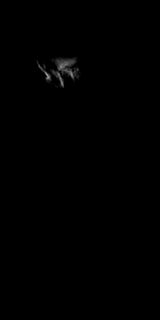

[Series 5: PD fat-sat · oblique · 4.5mm · 0.47mm/px · 5 of 25 slices shown]
[im 1/25]
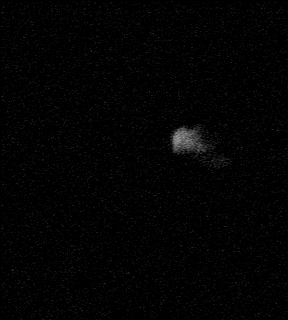
[im 7/25]
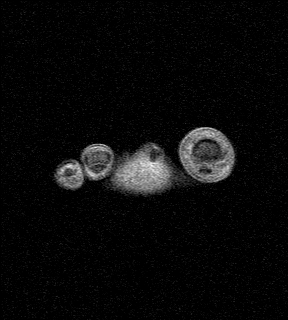
[im 13/25]
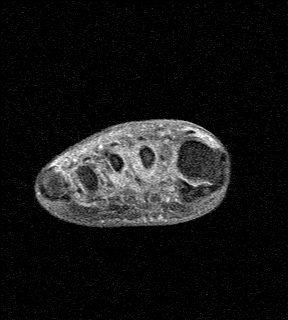
[im 19/25]
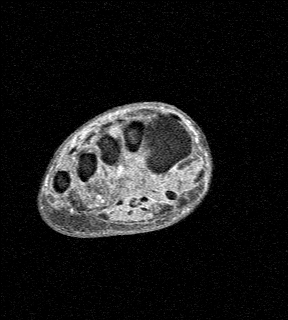
[im 25/25]
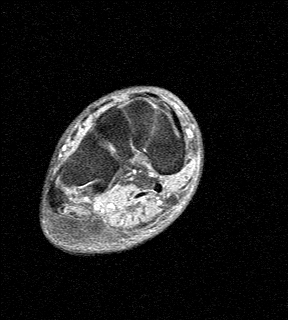

[Series 6: STIR · oblique · 4.0mm · 1.02mm/px · 4 of 20 slices shown (1 of 2)]
[im 1/20]
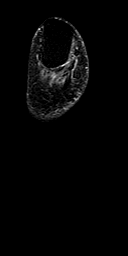
[im 7/20]
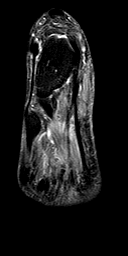
[im 13/20]
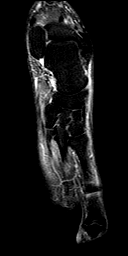
[im 20/20]
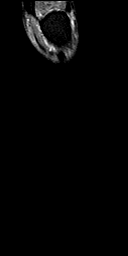

[Series 7: T1 · sagittal · 3.5mm · 0.69mm/px · 5 of 22 slices shown (2 of 4)]
[im 1/22]
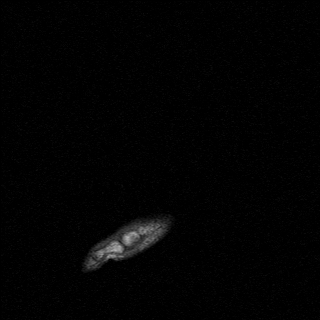
[im 6/22]
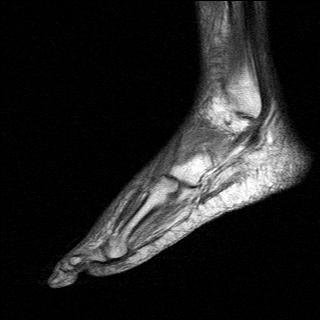
[im 11/22]
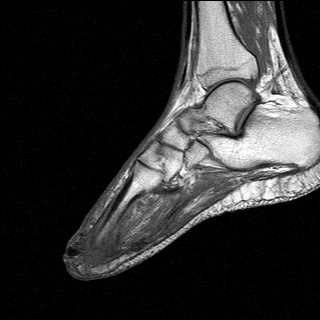
[im 16/22]
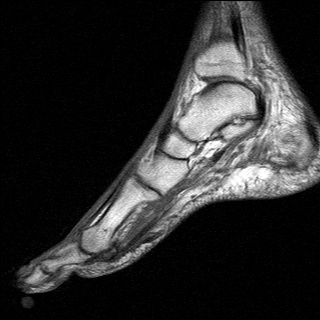
[im 22/22]
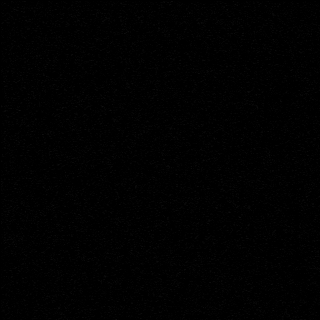

[Series 8: STIR · sagittal · 4.5mm · 0.86mm/px · 3 of 16 slices shown (2 of 2)]
[im 1/16]
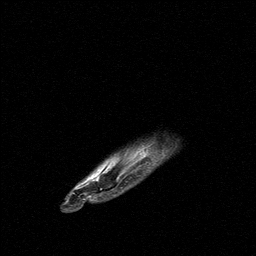
[im 8/16]
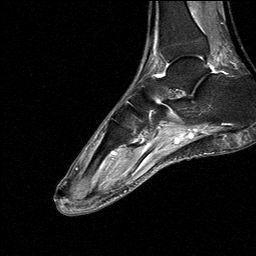
[im 16/16]
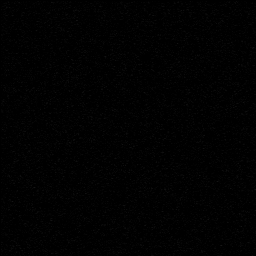

[Series 9: T1 · oblique · 4.5mm · 0.47mm/px · 4 of 20 slices shown (3 of 4)]
[im 1/20]
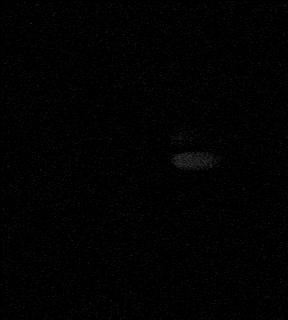
[im 7/20]
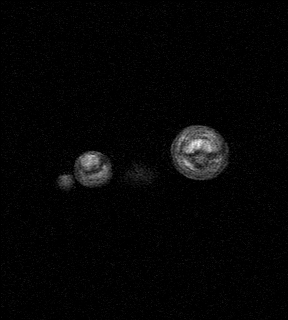
[im 13/20]
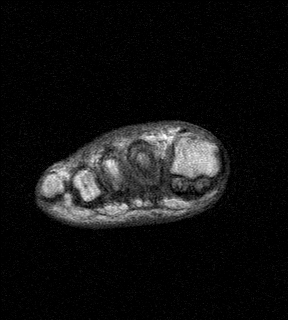
[im 20/20]
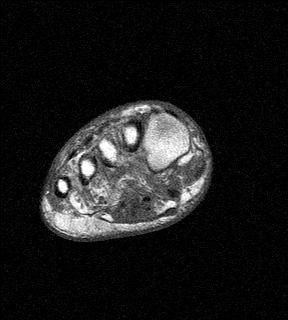

[Series 11: T1 · oblique · 4.0mm · 0.81mm/px · 4 of 20 slices shown (4 of 4)]
[im 1/20]
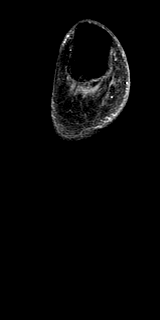
[im 7/20]
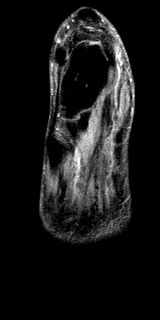
[im 13/20]
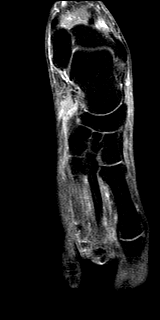
[im 20/20]
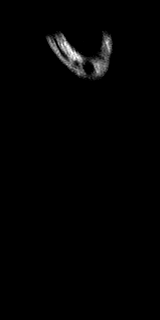

[Series 12: T1 fat-sat post-contrast · sagittal · 4.0mm · 0.86mm/px · 5 of 22 slices shown (1 of 2)]
[im 1/22]
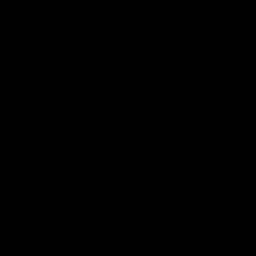
[im 6/22]
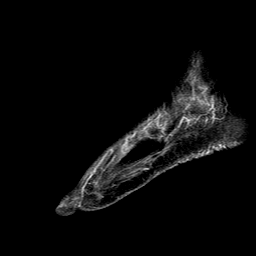
[im 11/22]
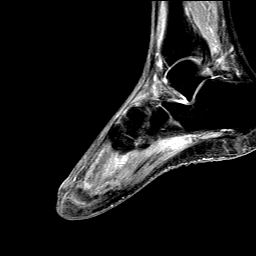
[im 16/22]
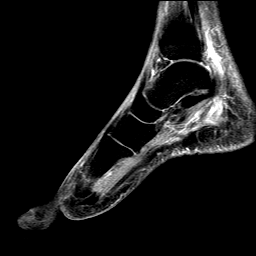
[im 22/22]
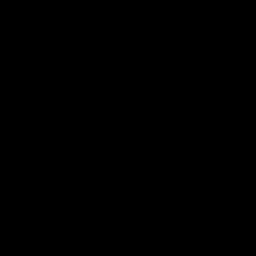

[Series 13: T1 fat-sat post-contrast · oblique · 4.5mm · 0.59mm/px · 5 of 25 slices shown (2 of 2)]
[im 1/25]
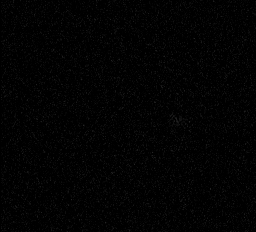
[im 7/25]
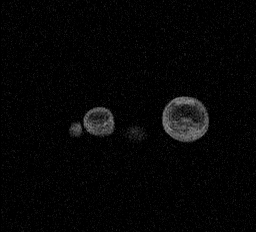
[im 13/25]
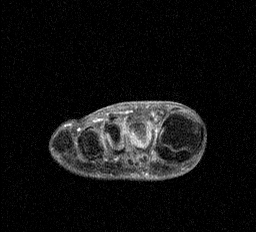
[im 19/25]
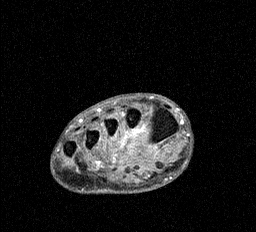
[im 25/25]
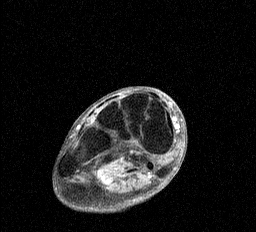

[40 of 40 positions shown; findings below may reference images not displayed]

DIAGNOSTIC STUDIES

EXAM

MRI of the right foot with and without contrast.

INDICATION

osteomyelitis right foot
DIABETIC PT WITH LEFT FOOT BELOW KNEE AMPUTATION WITH CONTINUED CELLULITIS, PAIN, SWELLING TO RT
FOOT, 1ST TOE BLACK.  HX PREV AMPUTATION TO TOES.  15 ML GADAVIST RG

TECHNIQUE

Sagittal, axial, and coronal images were obtained with variable T1 and T2 weighting before and
after administration of gadolinium.

COMPARISONS

None available

FINDINGS

Patient is status post prior amputation of the 2nd and 3rd digits. There is edema and enhancement
with cortical erosion involving the the head of the 2nd metatarsal concerning for osteomyelitis. No
fluid collections are seen to indicate abscess. Overlying soft tissue edema is evident.

IMPRESSION

Findings suggesting osteomyelitis involving the head of the 2nd metatarsal without evidence for
abscess. There is adjacent soft tissue edema.

Patient is apparently status post at mutation the 2nd and 3rd digits.

Tech Notes:

DIABETIC PT WITH LEFT FOOT BELOW KNEE AMPUTATION WITH CONTINUED CELLULITIS, PAIN, SWELLING TO RT
FOOT, 1ST TOE BLACK.  HX PREV AMPUTATION TO TOES.  15 ML GADAVIST RG

## 2021-09-12 IMAGING — CR [ID]
3 series · 3 of 3 positions shown · non-contrast
Comparison: none

[x foot ap right]
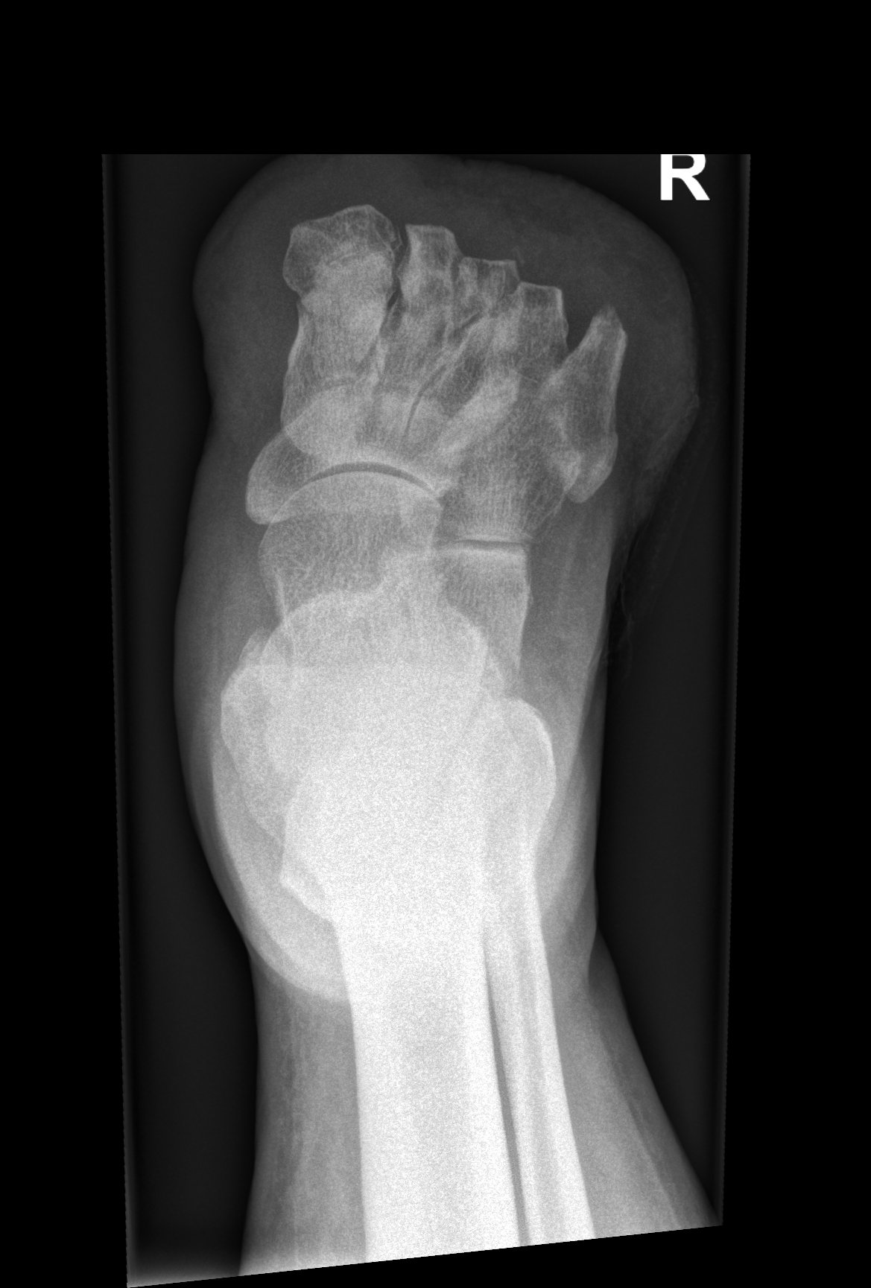

[x foot obl right]
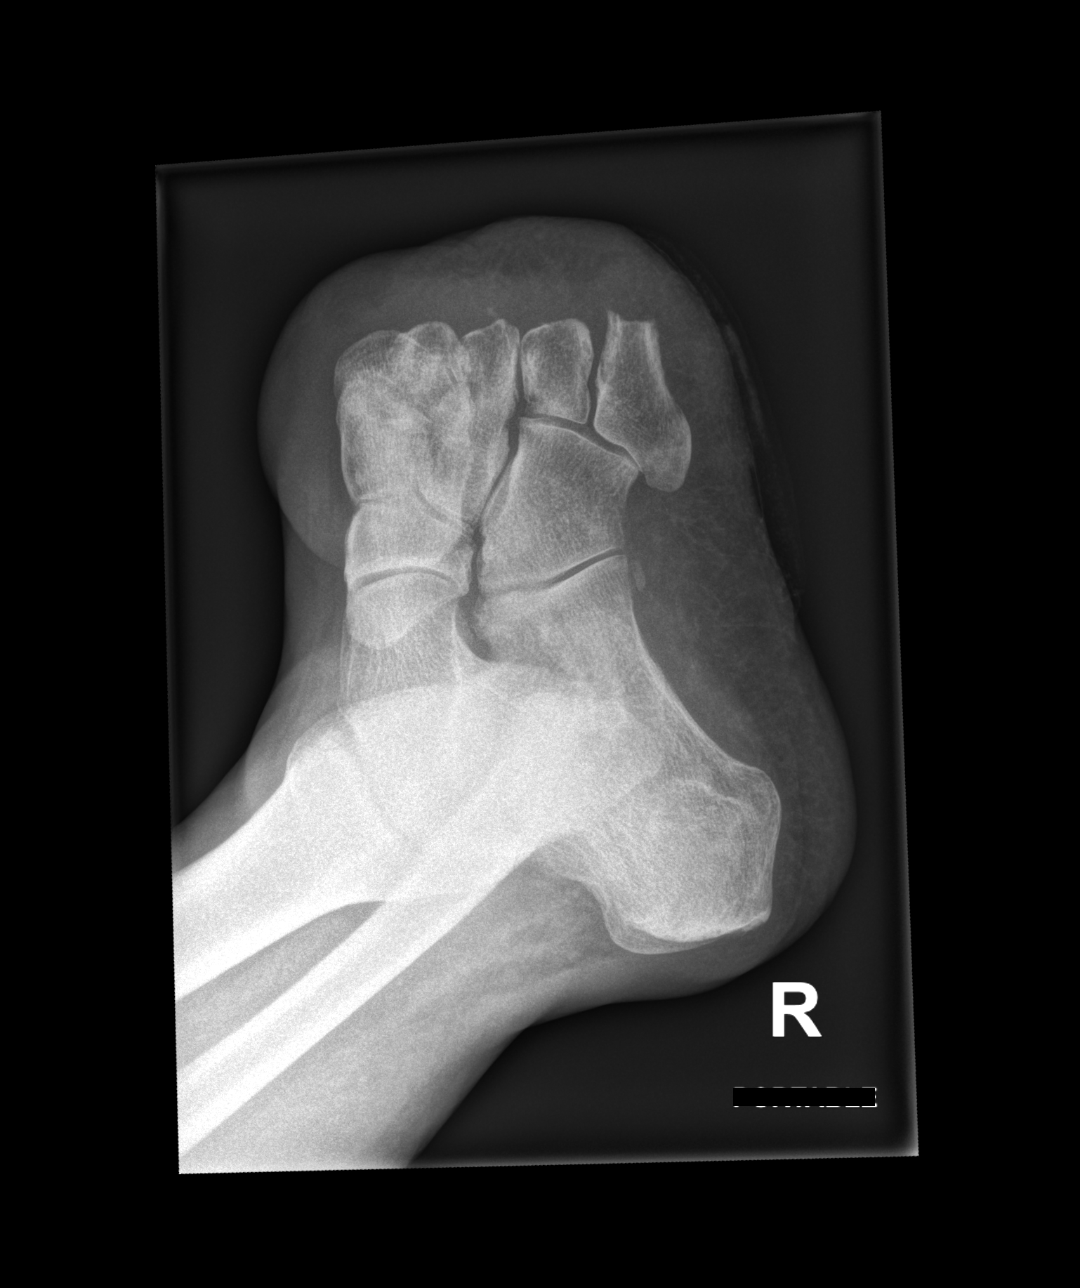

[x foot lat right]
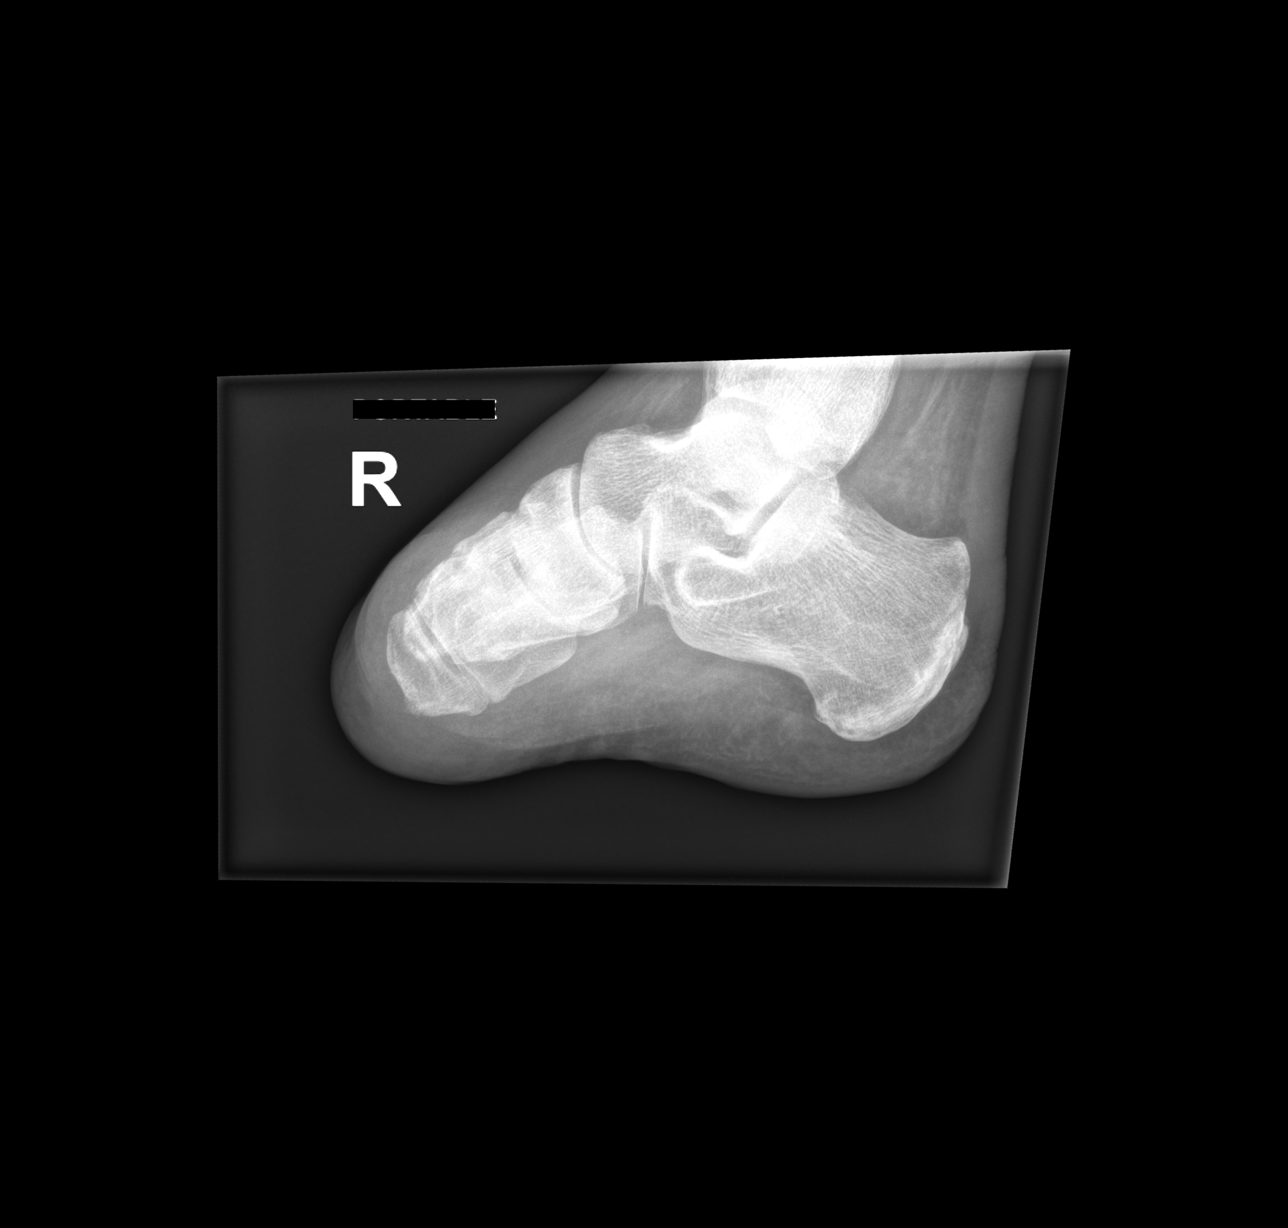

[3 of 3 positions shown; findings below may reference images not displayed]

DIAGNOSTIC STUDIES

EXAM

RADIOLOGICAL EXAMINATION, FOOT RIGHT; COMPLETE 3 OR MORE VIEWS CPT 18784

INDICATION

Nonhealing wound on bottom of foot/lateral side.

TECHNIQUE

AP, lateral, and oblique views of the right foot were obtained.

COMPARISONS

03/04/2021.

FINDINGS

Status post amputation at the level of the base the metatarsals. There is soft tissue swelling.
There are no definite erosions.

IMPRESSION

1. Status post amputation. Soft tissue swelling. No definite osteomyelitis.

Follow-up with an MRI as clinically indicated.

Tech Notes:

non healin wound on bottom of foot and lateral side. hx of amputation on rt foot and left leg

## 2021-09-13 IMAGING — MR FOOTRTWW
6 of 10 series · 28 of 40 positions shown · IV contrast (Gadavist)
Comparison: none

[Series 3: PD fat-sat · coronal · right · 3.5mm · 0.44mm/px · 5 of 40 slices shown]
[im 1/40]
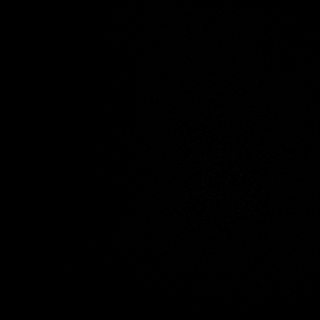
[im 10/40]
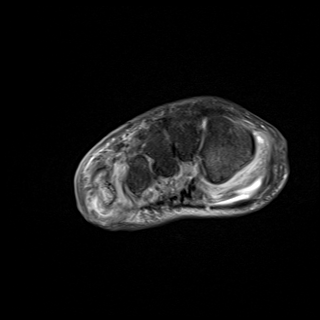
[im 20/40]
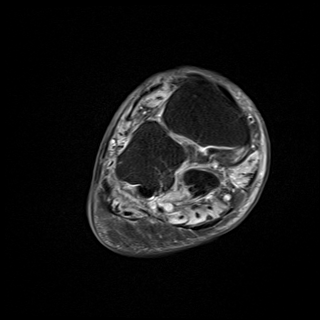
[im 30/40]
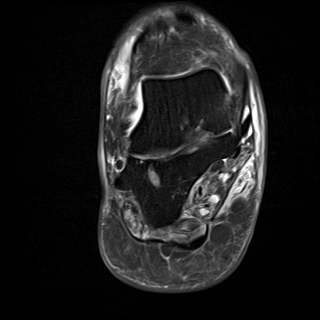
[im 40/40]
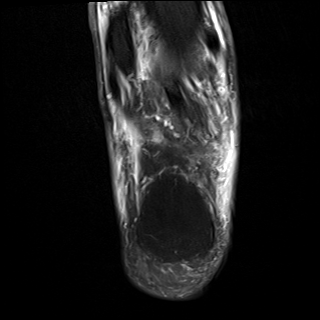

[Series 4: T1 · axial · right · 3.0mm · 0.45mm/px · z∈[-21,+62]mm · 4 of 25 slices shown (1 of 2)]
[im 1/25]
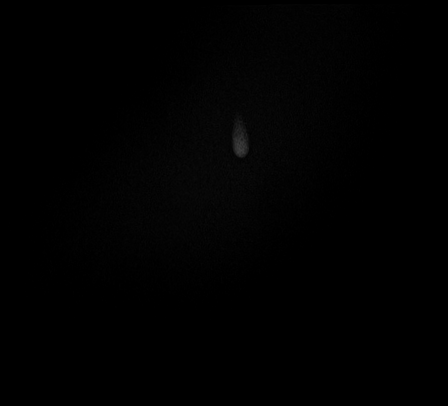
[im 9/25]
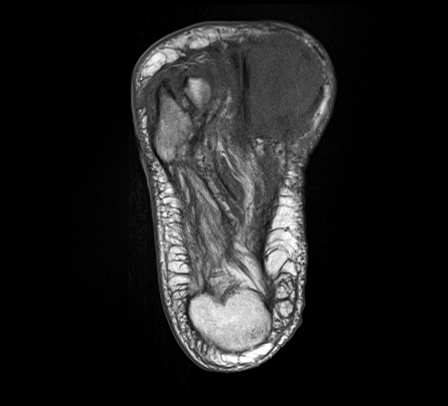
[im 17/25]
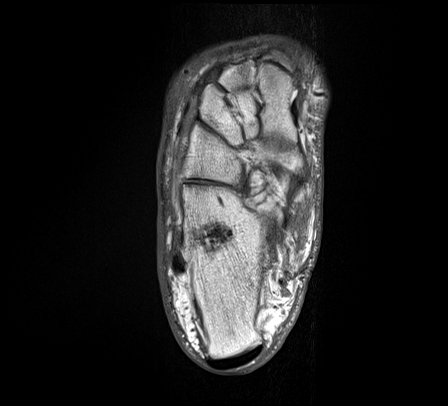
[im 25/25]
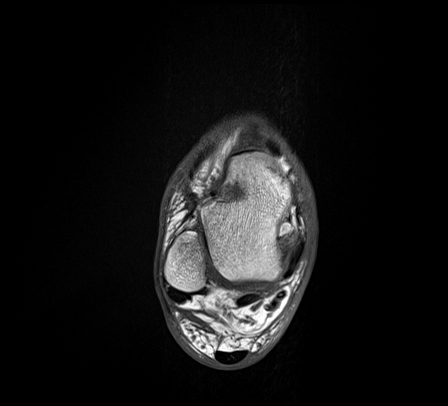

[Series 7: T1 · sagittal · right · 3.0mm · 0.36mm/px · 5 of 36 slices shown (2 of 2)]
[im 1/36]
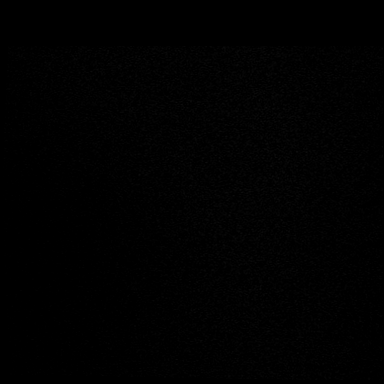
[im 9/36]
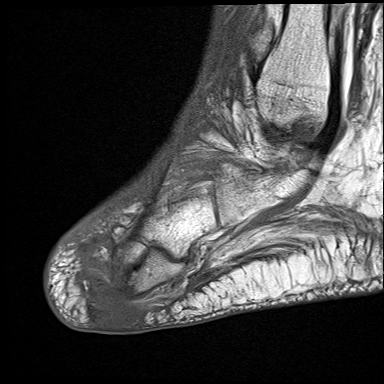
[im 18/36]
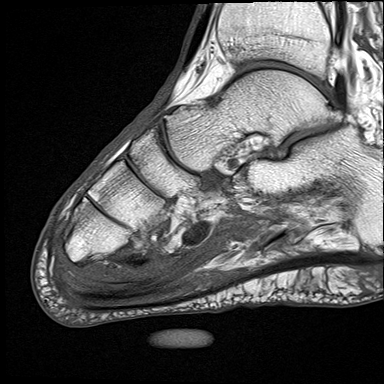
[im 27/36]
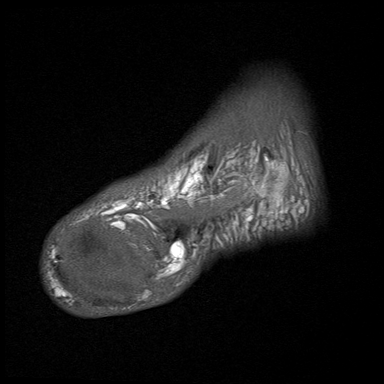
[im 36/36]
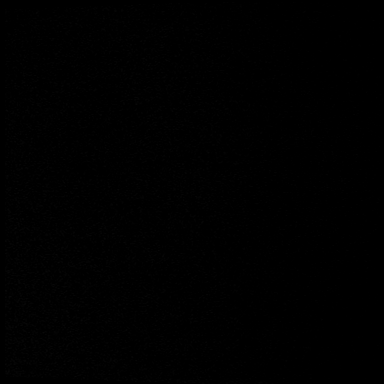

[Series 8: T1 post-contrast · axial · right · 3.0mm · 0.45mm/px · z∈[-21,+62]mm · 4 of 25 slices shown]
[im 1/25]
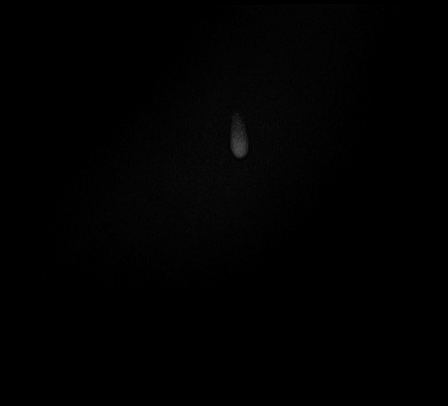
[im 9/25]
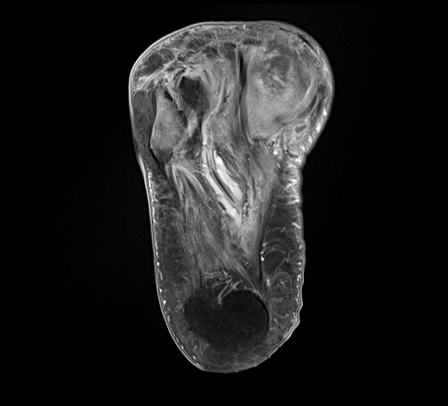
[im 17/25]
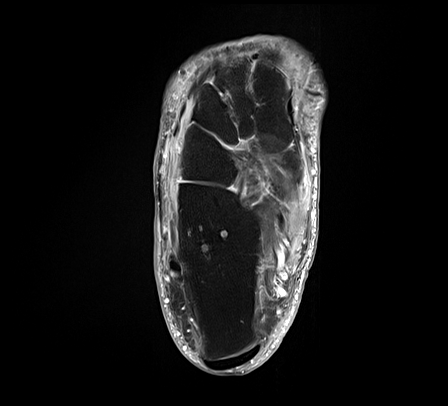
[im 25/25]
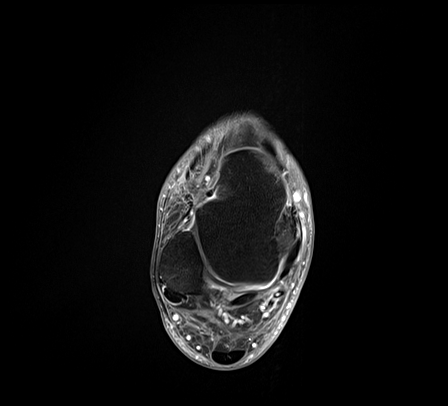

[Series 9: T1 fat-sat post-contrast · sagittal · right · 3.0mm · 0.46mm/px · 5 of 36 slices shown (1 of 2)]
[im 1/36]
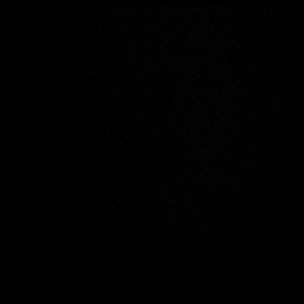
[im 9/36]
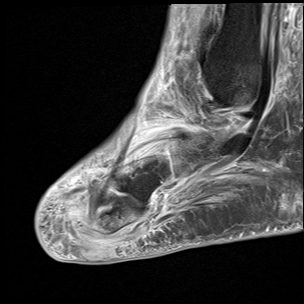
[im 18/36]
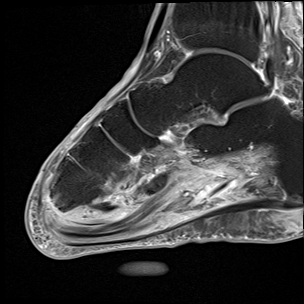
[im 27/36]
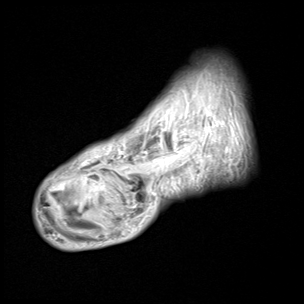
[im 36/36]
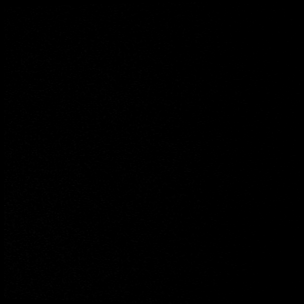

[Series 10: T1 fat-sat post-contrast · coronal · right · 3.5mm · 0.23mm/px · 5 of 40 slices shown (2 of 2)]
[im 1/40]
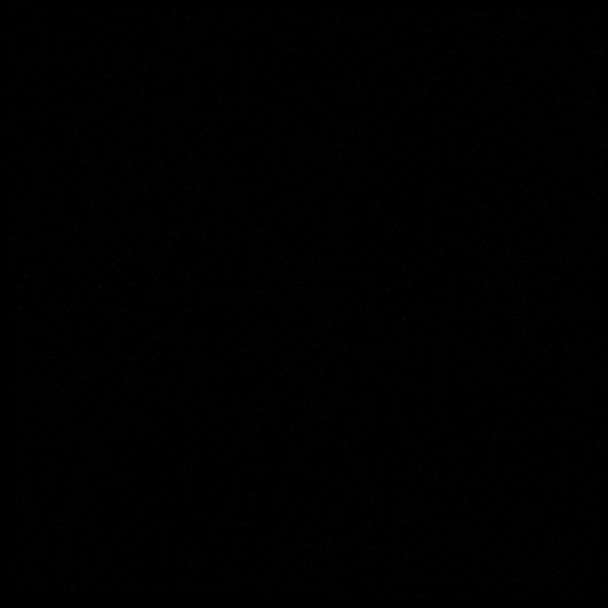
[im 8/40]
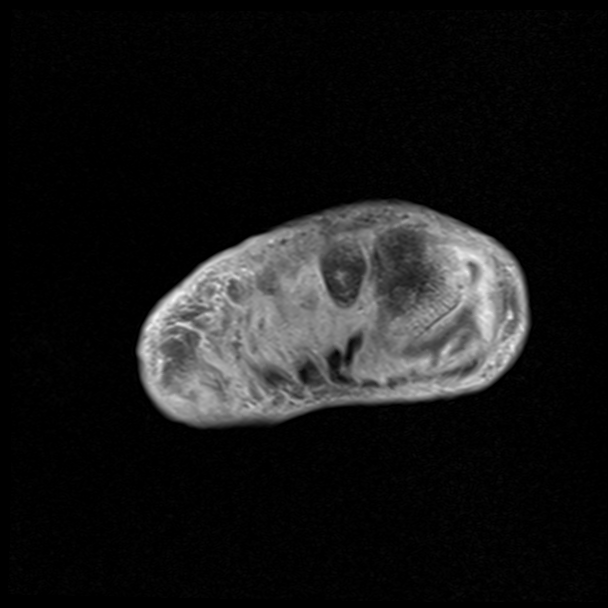
[im 16/40]
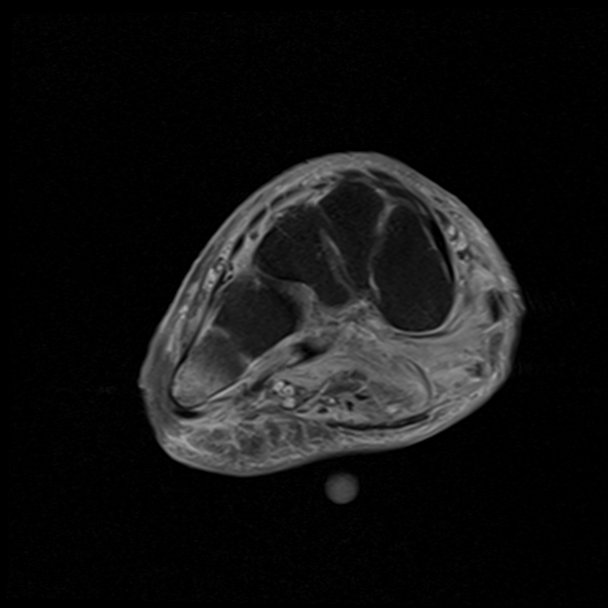
[im 24/40]
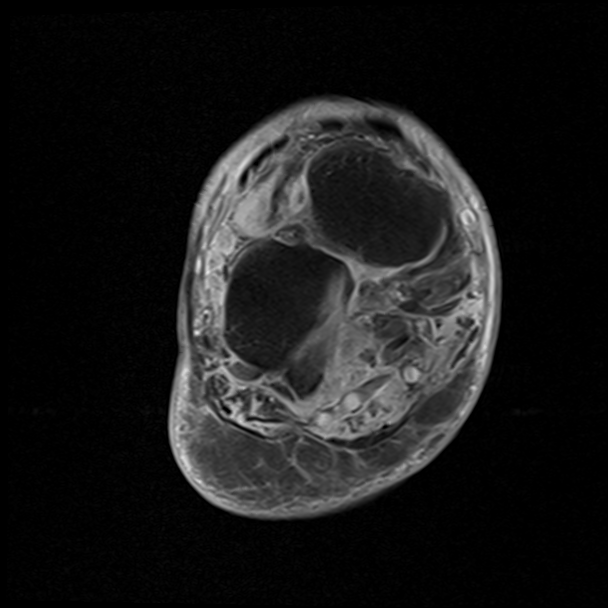
[im 32/40]
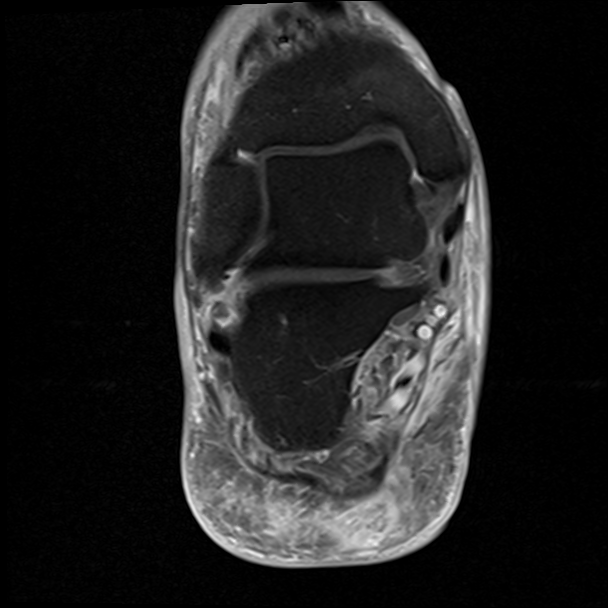

[28 of 40 positions shown; findings below may reference images not displayed]

DIAGNOSTIC STUDIES

EXAM

MR foot RT wo/w con

INDICATION

Right foot wound, cellulitis, h/o osteomyelitis
ULCER TO RT PLANTAR SUFACE OF FOOT ON DIABETIC PATIENT.  HX OF DIABETIC FOOT ULCERS WITH AMPUTATION
OF TOES AND LEFT LOWER LEG.  15 ML GADAVIST RG

TECHNIQUE

Sagittal axial coronal images were obtained with variable T1 and T2 weighting before after
administration of intravenous contrast.

COMPARISONS

February 25, 2021

FINDINGS

Patient is status post amputation of the distal foot at the level of the proximal metatarsals. There
are marked inflammatory changes along the plantar aspect of foot medially and laterally. Plantar rim
enhancing fluid collection can be seen image 18 series 8 measuring 2.8 cm maximally concerning for
abscess. Anterior plantar complex fluid collection is seen adjacent to the 1st metatarsal. Image 15
series 8 concerning for additional abscess.

There is also a probable abscess adjacent to the plantar base of the distal aspect of the 5th
metatarsal.

Probable early cortical destruction can be seen involving the remaining base of the 1st metatarsal
and possibly the 5th concerning for osteomyelitis.

Diffuse soft tissue edema is seen.

IMPRESSION

Postop changes as described with soft tissue rim enhancing fluid collections concerning for abscess
along the plantar aspect of the foot both medially and laterally.

Findings concerning for early osteomyelitis involving the 1st and 5th remaining metatarsals.

Report called to Dr. Kishore at [DATE] p.m..

Tech Notes:

ULCER TO RT PLANTAR SUFACE OF FOOT ON DIABETIC PATIENT.  HX OF DIABETIC FOOT ULCERS WITH AMPUTATION
OF TOES AND LEFT LOWER LEG.  15 ML GADAVIST RG

## 2021-09-19 IMAGING — CR CXRPOST
1 series · 1 of 1 positions shown · non-contrast
Comparison: None available at the time of dictation.

XR chest 1v post procedure Sex:

Burgel, Gabriel Werner; Saniya Mickelson, CRNA
XR chest 1v post procedure
INDICATION: PICC INSERTION W/ANESTHESIA. TOTAL FLUORO TIME- 0.01 SECS, TOTAL FLUORO DOSE- 10.2 mGy, TOTAL FLUORO
[id number]
TECHNIQUE: Single view of the chest was acquired.

[x chest ap]
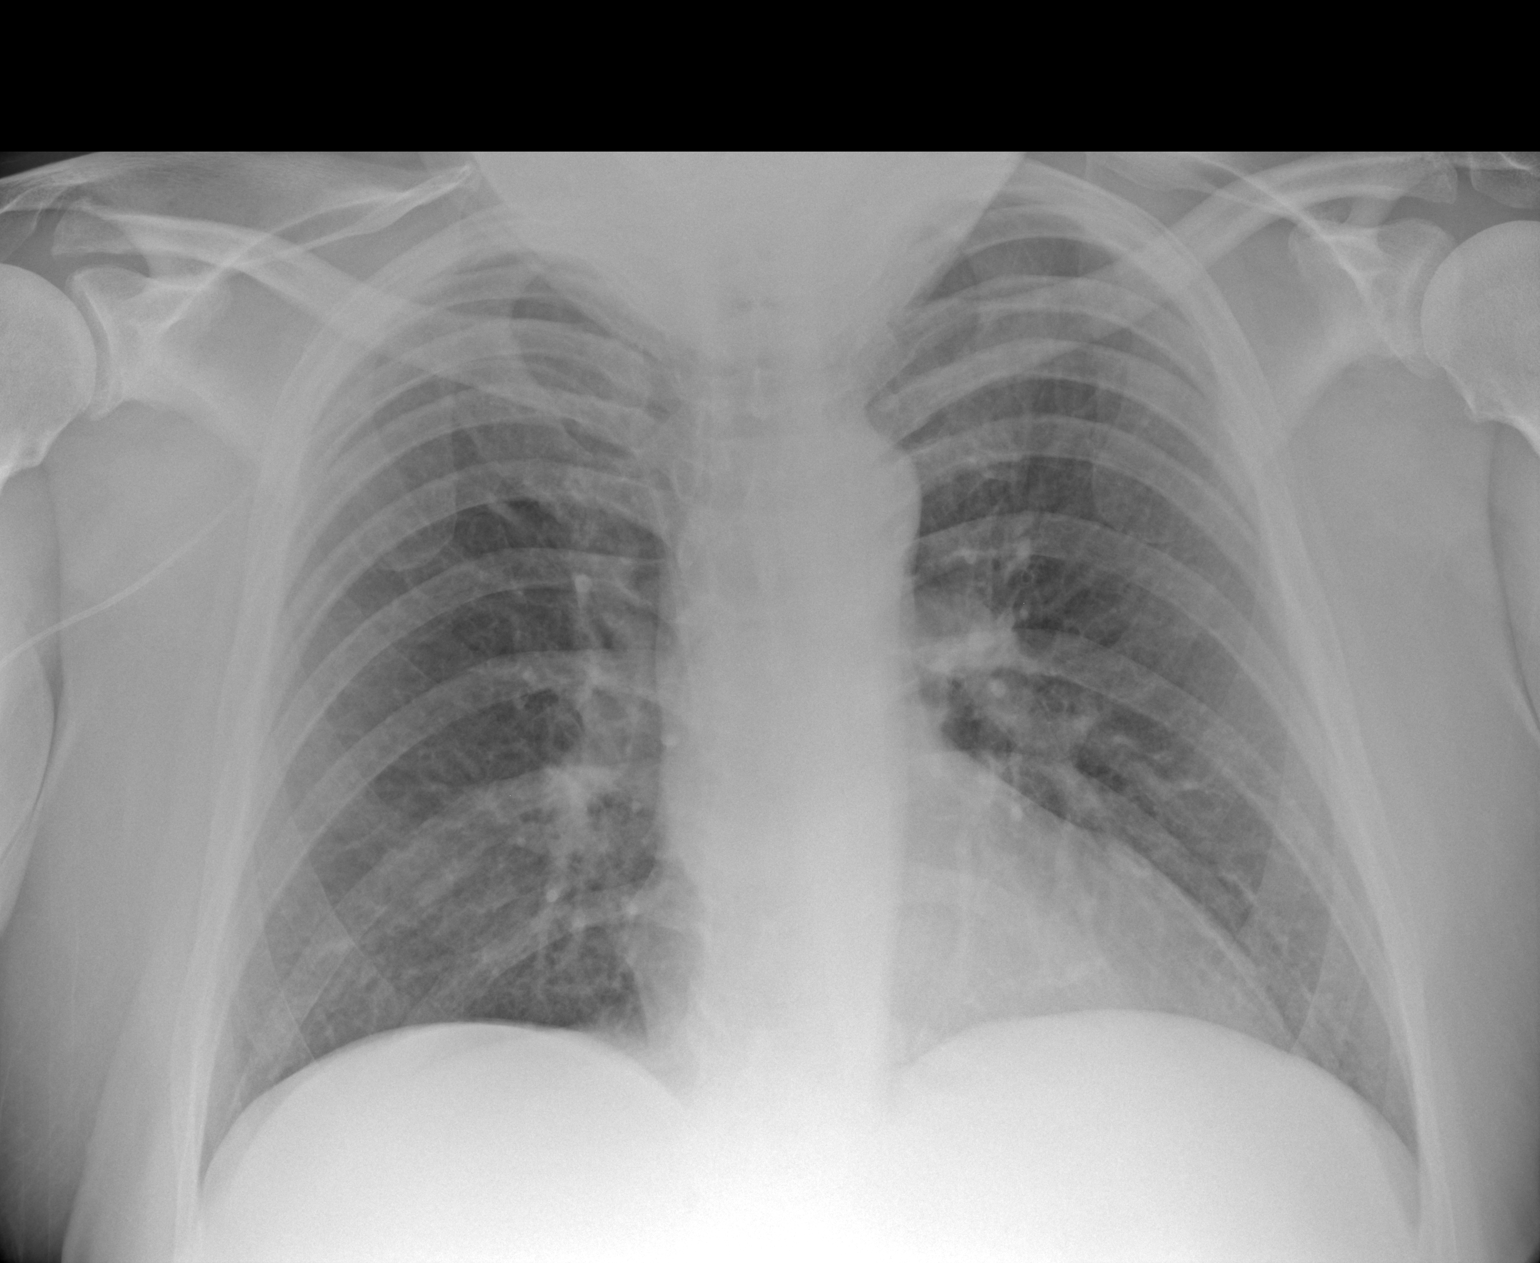

[1 of 1 positions shown; findings below may reference images not displayed]

FINDINGS: LUNGS:
*  No pleural effusion
*  No consolidation
*  No pneumothorax

MEDIASTINUM:
*  Unremarkable

BONES:
*  No acute osseous abnormality

OTHER:
*  PICC line located within the inferior right atrium.

===========
IMPRESSION: 1.  No interval pneumothorax after line placement.

Tech Notes:

PICC INSERTION W/ANESTHESIA. TOTAL FLUORO TIME- 0.01 SECS, TOTAL FLUORO DOSE- 10.2 mGy, TOTAL FLUORO
[id number]

## 2021-09-19 IMAGING — RF GDPICCPL
1 series · 2 of 2 positions shown · non-contrast
Comparison: None available at the time of dictation.

FL guided PICC placement Sex:

Alff, Tang Y; Chuen Wai Vallon, CRNA
FL guided PICC placement
INDICATION: Antibiotics
PICC INSERTION W/ANESTHESIA. TOTAL FLUORO TIME- 0.01 SECS, TOTAL FLUORO DOSE- 10.2 mGy, TOTAL FLUORO
[id number]
TECHNIQUE: 2 images

[Series 8: fluoro_chest_bw · 2 of 2 frames shown]
[frame 1/2]
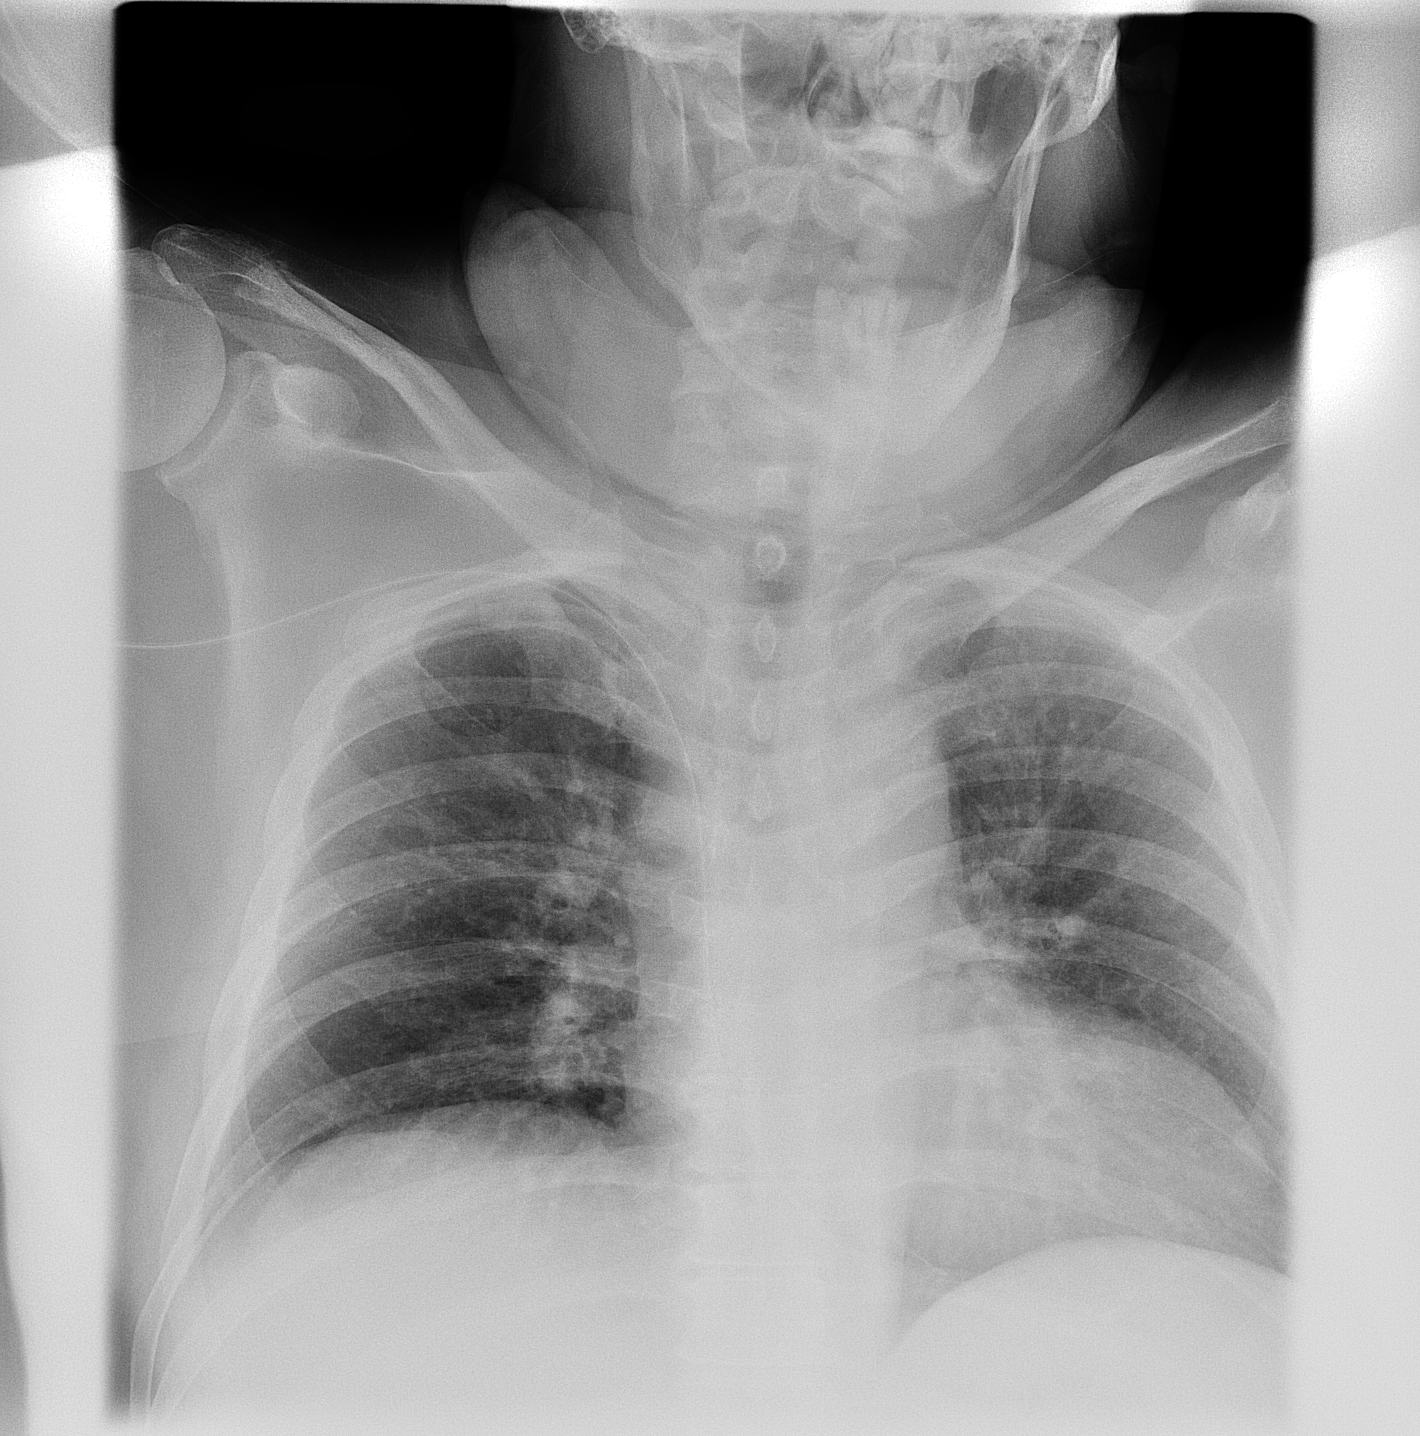
[frame 2/2]
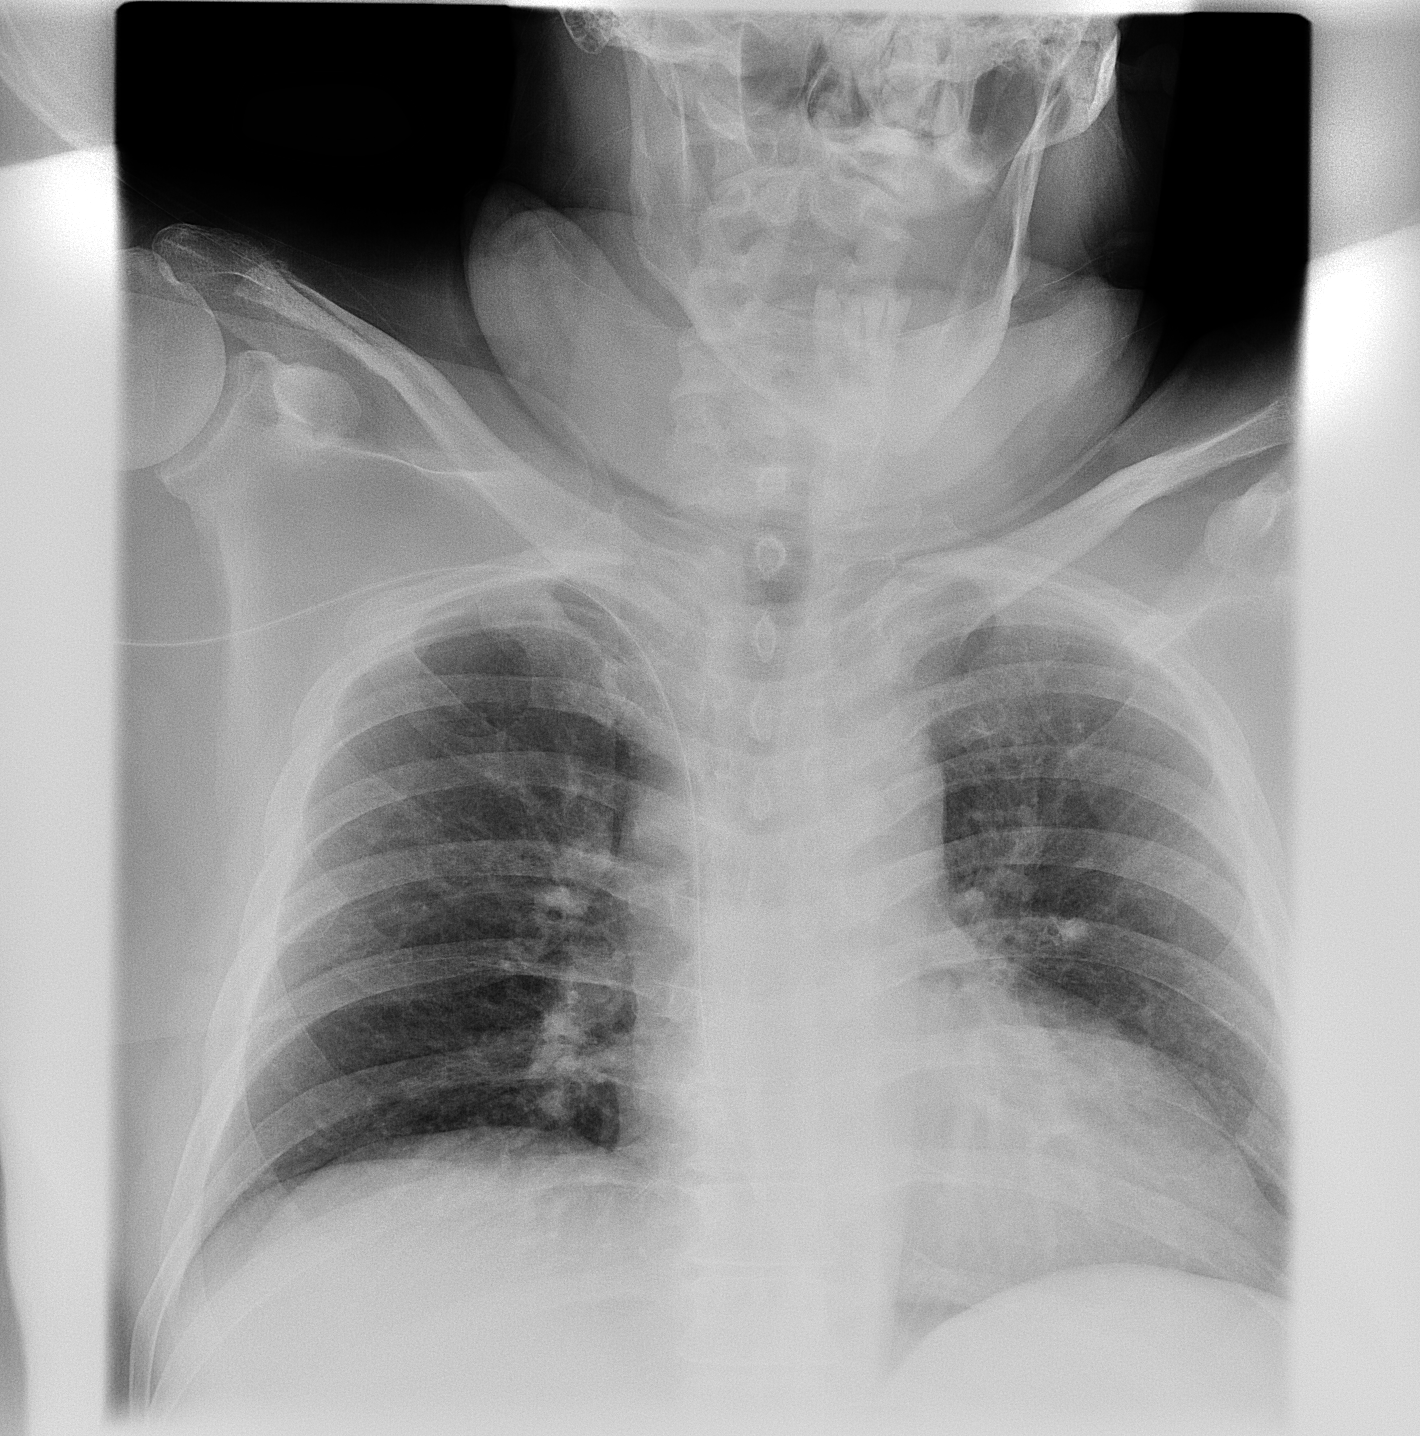

[2 of 2 positions shown; findings below may reference images not displayed]

FINDINGS: 2 images were obtained for placement of PICC line. Distal tip of the PICC line is located within the
inferior right atrium.
IMPRESSION: 1.  Successful fluoroscopic guidance for PICC line placement.

Tech Notes:

PICC INSERTION W/ANESTHESIA. TOTAL FLUORO TIME- 0.01 SECS, TOTAL FLUORO DOSE- 10.2 mGy, TOTAL FLUORO
[id number]

## 2021-09-20 IMAGING — CR [ID]
1 series · 1 of 1 positions shown · non-contrast
Comparison: none

[x chest ap]
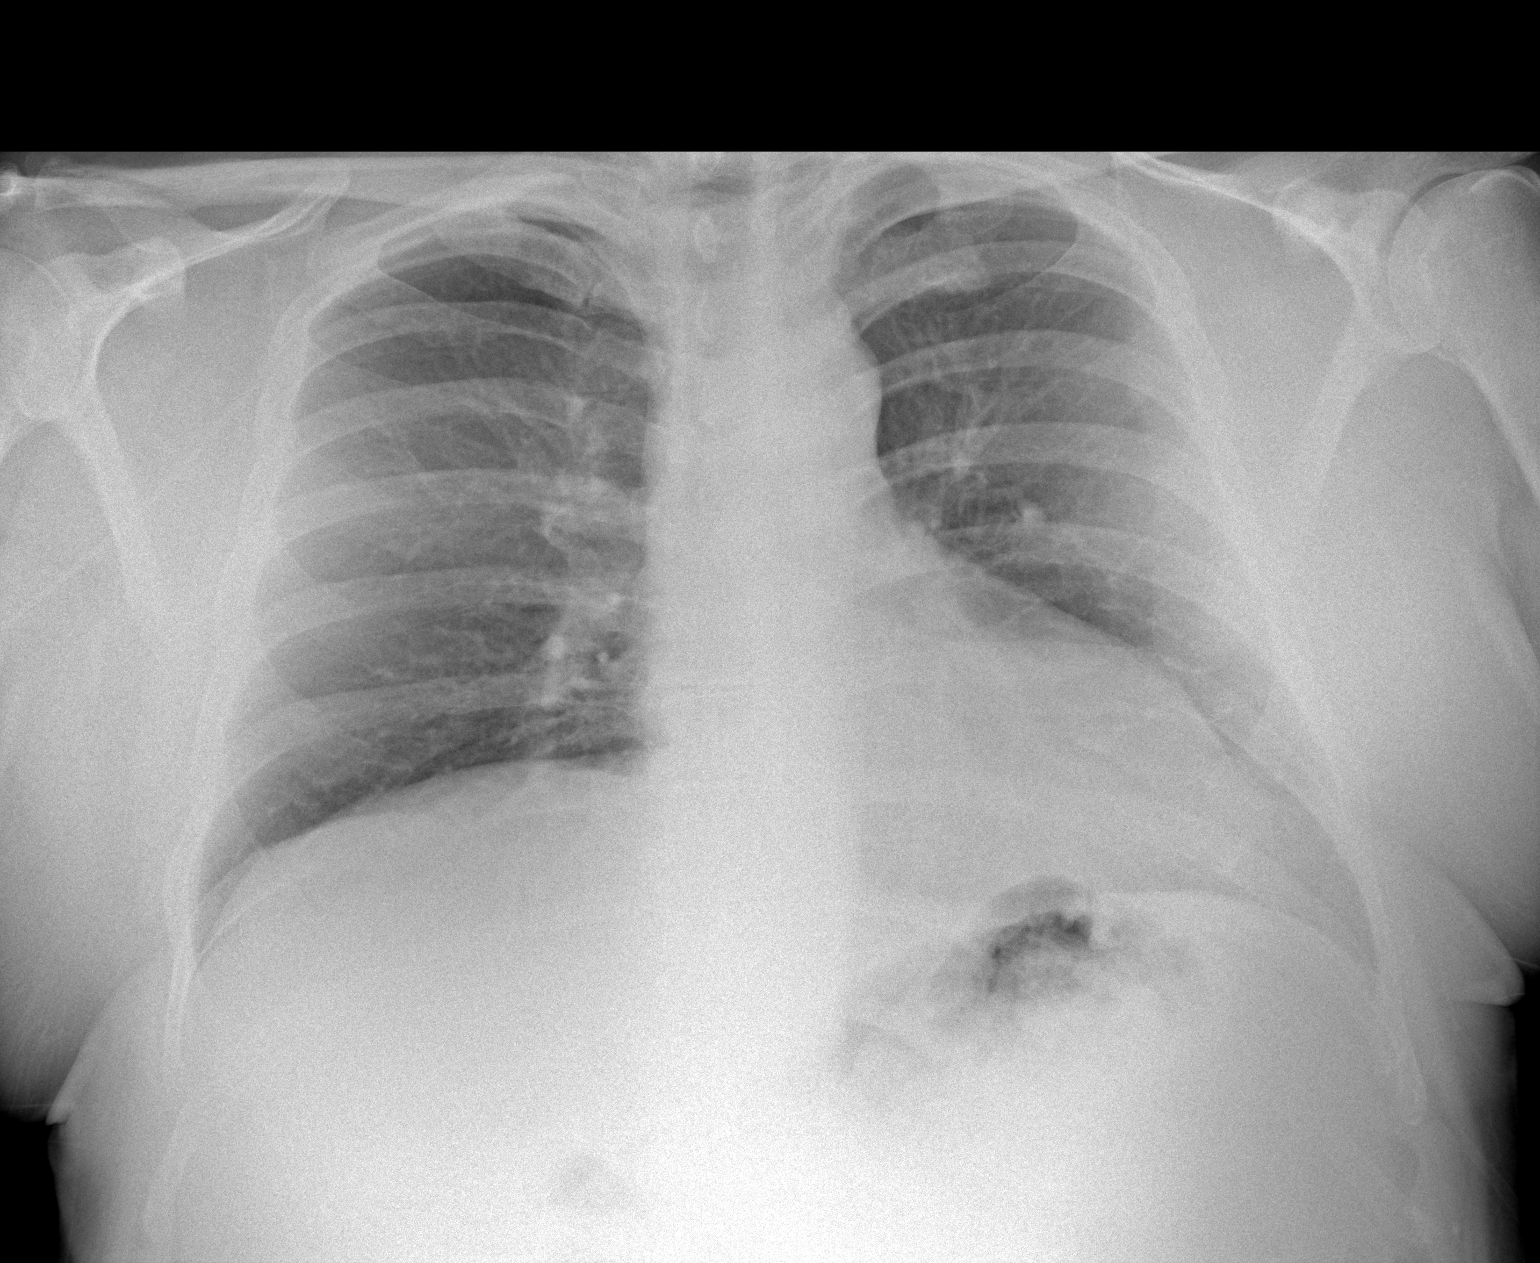

[1 of 1 positions shown; findings below may reference images not displayed]

Del Carmen, Roberth Ismael; Tanner Hutcheson, CRNA

EXAM

RADIOLOGICAL EXAMINATION, CHEST; SINGLE VIEW, FRONTAL CPT 08080

INDICATION

PICC line withdrawl
picc line withdraw

TECHNIQUE

Single PA portable view of the chest was performed.

COMPARISONS

09/19/2021

FINDINGS

Right-sided PICC line. The tip of the PICC line is overlying the superior vena cava. The heart
appears normal in size. The heart appears normal in size. Mild peribronchial thickening. No dense
consolidation. No acute fracture. Limited evaluation of the thoracic spine.

IMPRESSION

1. Right-sided PICC line in good position.

2. Mild peribronchial thickening. No dense consolidation.

Tech Notes:

picc line withdraw

## 2022-11-14 ENCOUNTER — Encounter: Admit: 2022-11-14 | Discharge: 2022-11-14 | Payer: MEDICARE

## 2022-11-14 NOTE — Telephone Encounter
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 Cherene Julian for this.  Appnt scheduled for 10/29 @10 :50 am.  Attempted to call Lana back [phone] but no answer or VM option.  LVM for pt [phone] stating appnt date/time/location.

## 2022-11-24 ENCOUNTER — Encounter: Admit: 2022-11-24 | Discharge: 2022-11-24 | Payer: MEDICARE

## 2022-11-24 DIAGNOSIS — T8789 Other complications of amputation stump: Secondary | ICD-10-CM
